# Patient Record
Sex: Male | Born: 1980 | Race: Black or African American | Hispanic: No | Marital: Single | Smoking: Current every day smoker
Health system: Southern US, Community
[De-identification: ages and names within clinical notes are randomized; demographics above are authoritative.]

## PROBLEM LIST (undated history)

## (undated) DIAGNOSIS — I10 Essential (primary) hypertension: Secondary | ICD-10-CM

---

## 2005-06-21 ENCOUNTER — Emergency Department: Payer: Self-pay | Admitting: Internal Medicine

## 2007-12-24 ENCOUNTER — Emergency Department: Payer: Self-pay | Admitting: Emergency Medicine

## 2009-04-26 ENCOUNTER — Emergency Department: Payer: Self-pay | Admitting: Emergency Medicine

## 2009-05-24 ENCOUNTER — Ambulatory Visit: Payer: Self-pay | Admitting: Family Medicine

## 2010-02-21 ENCOUNTER — Emergency Department: Payer: Self-pay | Admitting: Emergency Medicine

## 2010-03-13 ENCOUNTER — Emergency Department: Payer: Self-pay | Admitting: Internal Medicine

## 2010-06-13 ENCOUNTER — Emergency Department: Payer: Self-pay | Admitting: Emergency Medicine

## 2011-03-13 ENCOUNTER — Emergency Department: Payer: Self-pay | Admitting: Emergency Medicine

## 2011-05-22 ENCOUNTER — Emergency Department: Payer: Self-pay | Admitting: Internal Medicine

## 2011-12-11 ENCOUNTER — Emergency Department: Payer: Self-pay | Admitting: Emergency Medicine

## 2012-01-20 ENCOUNTER — Emergency Department: Payer: Self-pay | Admitting: Emergency Medicine

## 2012-10-28 ENCOUNTER — Emergency Department: Payer: Self-pay | Admitting: Emergency Medicine

## 2012-11-24 ENCOUNTER — Emergency Department: Payer: Self-pay | Admitting: Emergency Medicine

## 2013-02-21 ENCOUNTER — Ambulatory Visit: Payer: Self-pay | Admitting: Family Medicine

## 2013-02-21 DIAGNOSIS — R7611 Nonspecific reaction to tuberculin skin test without active tuberculosis: Secondary | ICD-10-CM | POA: Insufficient documentation

## 2013-09-01 ENCOUNTER — Emergency Department: Payer: Self-pay | Admitting: Internal Medicine

## 2014-02-28 LAB — HM HIV SCREENING LAB: HM HIV Screening: NEGATIVE

## 2014-03-08 ENCOUNTER — Emergency Department: Payer: Self-pay | Admitting: Emergency Medicine

## 2014-05-04 ENCOUNTER — Emergency Department: Payer: Self-pay | Admitting: Emergency Medicine

## 2014-08-20 ENCOUNTER — Emergency Department: Payer: Self-pay | Admitting: Emergency Medicine

## 2014-09-16 ENCOUNTER — Emergency Department: Payer: Self-pay | Admitting: Emergency Medicine

## 2014-12-21 ENCOUNTER — Emergency Department: Payer: Self-pay | Admitting: Internal Medicine

## 2015-03-02 ENCOUNTER — Encounter: Payer: Self-pay | Admitting: Emergency Medicine

## 2015-03-02 ENCOUNTER — Emergency Department
Admission: EM | Admit: 2015-03-02 | Discharge: 2015-03-02 | Disposition: A | Discharge status: Home / Self Care | Payer: Self-pay | Attending: Emergency Medicine | Admitting: Emergency Medicine

## 2015-03-02 ENCOUNTER — Emergency Department: Payer: Self-pay

## 2015-03-02 DIAGNOSIS — Y9289 Other specified places as the place of occurrence of the external cause: Secondary | ICD-10-CM | POA: Insufficient documentation

## 2015-03-02 DIAGNOSIS — Z720 Tobacco use: Secondary | ICD-10-CM | POA: Insufficient documentation

## 2015-03-02 DIAGNOSIS — X58XXXA Exposure to other specified factors, initial encounter: Secondary | ICD-10-CM | POA: Insufficient documentation

## 2015-03-02 DIAGNOSIS — Z87821 Personal history of retained foreign body fully removed: Secondary | ICD-10-CM

## 2015-03-02 DIAGNOSIS — T17208A Unspecified foreign body in pharynx causing other injury, initial encounter: Secondary | ICD-10-CM

## 2015-03-02 DIAGNOSIS — Y9389 Activity, other specified: Secondary | ICD-10-CM | POA: Insufficient documentation

## 2015-03-02 DIAGNOSIS — T17228A Food in pharynx causing other injury, initial encounter: Secondary | ICD-10-CM | POA: Insufficient documentation

## 2015-03-02 DIAGNOSIS — Y998 Other external cause status: Secondary | ICD-10-CM | POA: Insufficient documentation

## 2015-03-02 MED ORDER — PREDNISONE 20 MG PO TABS
ORAL_TABLET | ORAL | Status: AC
  Filled 2015-03-02: qty 3

## 2015-03-02 MED ORDER — CEPHALEXIN 500 MG PO CAPS
ORAL_CAPSULE | ORAL | Status: AC
  Filled 2015-03-02: qty 1

## 2015-03-02 NOTE — Discharge Instructions (Signed)
Swallowed Foreign Body, Adult You have swallowed an object (foreign body). Once the foreign body has passed through the food tube (esophagus), which leads from the mouth to the stomach, it will usually continue through the body without problems. This is because the point where the esophagus enters into the stomach is the narrowest place through which the foreign body must pass. Sometimes the foreign body gets stuck. The most common type of foreign body obstruction in adults is food impaction. Many times, bones from fish or meat products may become lodged in the esophagus or injure the throat on the way down. When there is an object that obstructs the esophagus, the most obvious symptoms are pain and the inability to swallow normally. In some cases, foreign bodies that can be life threatening are swallowed. Examples of these are certain medications and illicit drugs. Often in these instances, patients are afraid of telling what they swallowed. However, it is extremely important to tell the emergency caregiver what was swallowed because life-saving treatment may be needed.  X-ray exams may be taken to find the location of the foreign body. However, some objects do not show up well or may be too small to be seen on an X-ray image. If the foreign body is too large or too sharp, it may be too dangerous to allow it to pass on its own. You may need to see a caregiver who specializes in the digestive system (gastroenterologist). In a few cases, a specialist may need to remove the object using a method called "endoscopy". This involves passing a thin, soft, flexible tube into the food pipe to locate and remove the object. Follow up with your primary doctor or the referral you were given by the emergency caregiver. HOME CARE INSTRUCTIONS   If your caregiver says it is safe for you to eat, then only have liquids and soft foods until your symptoms improve.  Once you are eating normally:  Cut food into small  pieces.  Remove small bones from food.  Remove large seeds and pits from fruit.  Chew your food well.  Do not talk, laugh, or engage in physical activity while eating or swallowing. SEEK MEDICAL CARE IF:  You develop worsening shortness of breath, uncontrollable coughing, chest pains or high fever, greater than 102 F (38.9 C).  You are unable to eat or drink or you feel that food is getting stuck in your throat.  You have choking symptoms or cannot stop drooling.  You develop abdominal pain, vomiting (especially of blood), or rectal bleeding. MAKE SURE YOU:   Understand these instructions.  Will watch your condition.  Will get help right away if you are not doing well or get worse. Document Released: 03/26/2010 Document Revised: 12/29/2011 Document Reviewed: 03/26/2010 [facility] Patient Information 2015 [place] HillExitCare, [place]LLC. This information is not intended to replace advice given to you by your health care provider. Make sure you discuss any questions you have with your health care provider.  Today we successfully removed the fish bone stuck in the back of your throat.  You should not have any lasting effects from this accident or removal.  Rinse with salty-water to reduce any throat irritation.

## 2015-03-02 NOTE — ED Notes (Signed)
Few days ago ate some fish then next day developed soretroat, felt l;ike a fish bone was hung in his throat, no drooling, able to pass food and liquids, back of throat red with enlarged tonsils

## 2015-03-02 NOTE — ED Notes (Signed)
Pt with sore throat for two days. Thinks he might have a fish bone stuck in his throat. No resp distress noted.

## 2015-03-02 NOTE — ED Provider Notes (Signed)
[facility] Emergency Department Provider Note? ____________________________________________ ? Time seen: 1318 ? I have reviewed the triage vital signs and the nursing notes. ________ HISTORY ? Chief Complaint Sore Throat  HPI  David Peters is a 34 y.o. male who reports to the ED with foreign body sensation (FBS) to the throat. He describes it a few days ago he ate some fish, and the next day he didn't he developed a sore throat, and a scratchy sensation as if there was a bone hung in the right side of his throat. He was able to visualize the foreign body near the tonsils, but has been unable to manually dislodge it. He is here for evaluation and management. He denies any difficulty breathing, difficulty with oral secretions, shortness of breath, or swelling to the mouth or tonsils. He's been able to eat and drink without difficulty in the last few days.   Review of Systems  Constitutional: Negative for fever. Eyes: Negative for visual changes. ENT: Positive for sore throat & FBS. Cardiovascular: Negative for chest pain. Respiratory: Negative for shortness of breath. Gastrointestinal: Negative for abdominal pain, vomiting and diarrhea. Musculoskeletal: Negative for back pain. Skin: Negative for rash. Neurological: Negative for headaches, focal weakness or numbness.  10-point ROS otherwise negative. ____________________________________________  History reviewed. No pertinent past medical history.  There are no active problems to display for this patient. ? History reviewed. No pertinent past surgical history. ? No current outpatient prescriptions on file. ? Allergies Review of patient's allergies indicates no known allergies. ? No family history on file. ? Social History History  Substance Use Topics  . Smoking status: Current Every Day Smoker  . Smokeless tobacco: Not on file  . Alcohol Use: No   PHYSICAL EXAM:  VITAL SIGNS: ED Triage  Vitals  Enc Vitals Group     BP 03/02/15 1225 148/86 mmHg     Pulse Rate 03/02/15 1225 88     Resp 03/02/15 1225 20     Temp 03/02/15 1225 98.2 F (36.8 C)     Temp Source 03/02/15 1225 Oral     SpO2 03/02/15 1225 98 %     Weight 03/02/15 1225 220 lb (99.791 kg)     Height 03/02/15 1225 5\' 8"  (1.727 m)     Head Cir --      Peak Flow --      Pain Score 03/02/15 1226 5     Pain Loc --      Pain Edu? --      Excl. in GC? --    Constitutional: Alert and oriented. Well appearing and in no distress. Eyes: Conjunctivae are normal. PERRL. Normal extraocular movements. ENT   Head: Normocephalic and atraumatic.   Nose: No congestion/rhinnorhea.   Mouth/Throat: Mucous membranes are moist. There is a visible foreign body lodged in the right tonsillar region, consistent with a fish bone. Otherwise normal-appearing tonsils, normal midline uvula, and no edema to the oropharynx.      Ears: Normal external exam. Canals clear. TMs clear bilaterally.   Neck: Supple. No lymphadenopathy. Cardiovascular: Normal rate, regular rhythm. Normal and symmetric distal pulses are present in all extremities. No murmurs, rubs, or gallops. Respiratory: Normal respiratory effort without tachypnea nor retractions.  Musculoskeletal: Normal range of motion in all extremities.  Neurologic:  Normal speech and language. No gross focal neurologic deficits are appreciated.  Skin:  Skin is warm, dry and intact. No rash noted. Psychiatric: Mood and affect are normal. Patient exhibits appropriate insight  and judgment.  ___________ RADIOLOGY  Soft Tissue Neck  IMPRESSION: No abnormality noted. _____________ PROCEDURES ? Foreign Body Removal Procedure  Performed by - J. Tashawn Laswell, PA-C  FB visualized on gross exam - right peritonsilar area  Local anesthesia with cetacaine spray  Patient positioned - standing for comfort  FB manually removed using alligator forceps w/o difficulty  Patient tolerated  procedure without distress ______________________________________________________ INITIAL IMPRESSION / ASSESSMENT AND PLAN / ED COURSE ? Retained fish bone foreign body, visualized on exam, not appreciated on soft tissue films. Successful removal in the ED.  Pertinent labs & imaging results that were available during my care of the patient were reviewed by me and considered in my medical decision making (see chart for details).  ____________________________________________ FINAL CLINICAL IMPRESSION(S) / ED DIAGNOSES?  Final diagnoses:  Foreign body in throat, initial encounter  History of retained foreign body fully removed      Lissa HoardJenise V Bacon Jahmani Staup, PA-C 03/02/15 1714  Jene Everyobert Kinner, MD 03/02/15 [id number]  Jene Everyobert Kinner, MD 03/02/15 [phone]

## 2015-05-07 ENCOUNTER — Emergency Department: Payer: Self-pay

## 2015-05-07 ENCOUNTER — Emergency Department
Admission: EM | Admit: 2015-05-07 | Discharge: 2015-05-07 | Disposition: A | Discharge status: Home / Self Care | Payer: Self-pay | Attending: Emergency Medicine | Admitting: Emergency Medicine

## 2015-05-07 DIAGNOSIS — Z79899 Other long term (current) drug therapy: Secondary | ICD-10-CM | POA: Insufficient documentation

## 2015-05-07 DIAGNOSIS — R600 Localized edema: Secondary | ICD-10-CM

## 2015-05-07 DIAGNOSIS — R609 Edema, unspecified: Secondary | ICD-10-CM | POA: Insufficient documentation

## 2015-05-07 DIAGNOSIS — Z720 Tobacco use: Secondary | ICD-10-CM | POA: Insufficient documentation

## 2015-05-07 MED ORDER — IBUPROFEN 800 MG PO TABS: 800.0000 mg | ORAL_TABLET | Freq: Three times a day (TID) | ORAL | Status: DC | PRN

## 2015-05-07 MED ORDER — FUROSEMIDE 20 MG PO TABS: 20.0000 mg | ORAL_TABLET | Freq: Every day | ORAL | Status: DC

## 2015-05-07 NOTE — ED Notes (Signed)
To ultrasound

## 2015-05-07 NOTE — ED Provider Notes (Signed)
[facility]  ____________________________________________  Time seen: Approximately 3:08 PM  I have reviewed the triage vital signs and the nursing notes.   HISTORY  Chief Complaint Knee Pain   HPI Cleatrice BurkeCharlie J Connelley is a 34 y.o. male who presents with a sudden onset of right knee swelling and tenderness today. Patient states it felt like it went to sleep earlier and then noticed his lower leg was swollen as well denies any trauma no insect bites. He was working at work doing his normal job.   History reviewed. No pertinent past medical history.  There are no active problems to display for this patient.   History reviewed. No pertinent past surgical history.  Current Outpatient Rx  Name  Route  Sig  Dispense  Refill  . furosemide (LASIX) 20 MG tablet   Oral   Take 1 tablet (20 mg total) by mouth daily.   15 tablet   0   . ibuprofen (ADVIL,MOTRIN) 800 MG tablet   Oral   Take 1 tablet (800 mg total) by mouth every 8 (eight) hours as needed.   30 tablet   0     Allergies Review of patient's allergies indicates no known allergies.  No family history on file.  Social History History  Substance Use Topics  . Smoking status: Current Every Day Smoker -- 0.50 packs/day for 5 years    Types: Cigarettes  . Smokeless tobacco: Not on file  . Alcohol Use: Yes     Comment: pt states he drinks about a pint of liquor on the weekends.     Review of Systems Constitutional: No fever/chills Eyes: No visual changes. ENT: No sore throat. Cardiovascular: Denies chest pain. Respiratory: Denies shortness of breath. Gastrointestinal: No abdominal pain.  No nausea, no vomiting.  No diarrhea.  No constipation. Genitourinary: Negative for dysuria. Musculoskeletal: Negative for back pain. Skin: Negative for rash. Neurological: Negative for headaches, focal weakness or numbness.  10-point ROS otherwise  negative.  ____________________________________________   PHYSICAL EXAM:  VITAL SIGNS: ED Triage Vitals  Enc Vitals Group     BP --      Pulse --      Resp --      Temp --      Temp src --      SpO2 --      Weight --      Height --      Head Cir --      Peak Flow --      Pain Score --      Pain Loc --      Pain Edu? --      Excl. in GC? --     Constitutional: Alert and oriented. Well appearing and in no acute distress. Eyes: Conjunctivae are normal. PERRL. EOMI. Head: Atraumatic. Nose: No congestion/rhinnorhea. Mouth/Throat: Mucous membranes are moist.  Oropharynx non-erythematous. Neck: No stridor.   Cardiovascular: Normal rate, regular rhythm. Grossly normal heart sounds.  Good peripheral circulation. Respiratory: Normal respiratory effort.  No retractions. Lungs CTAB. Gastrointestinal: Soft and nontender. No distention. No abdominal bruits. No CVA tenderness. Musculoskeletal: No lower extremity tenderness nor edema.  No joint effusions. Positive edema to the right knee and right lower leg. Negative Homans sign Neurologic:  Normal speech and language. No gross focal neurologic deficits are appreciated. No gait instability. Skin:  Skin is warm, dry and intact. No rash noted. Psychiatric: Mood and affect are normal. Speech and behavior are normal.  ____________________________________________   LABS (all labs ordered are listed, but only abnormal results are displayed)  Labs Reviewed - No data to display ____________________________________________  EKG not applicable  RADIOLOGY  Ultrasound negative read by radiologist. ____________________________________________   PROCEDURES  Procedure(s) performed: None  Critical Care performed: No  ____________________________________________   INITIAL IMPRESSION / ASSESSMENT AND PLAN / ED COURSE  Pertinent labs & imaging results that were available during my care of the patient were reviewed by me and considered  in my medical decision making (see chart for details).  Right lower leg and knee edema. No known trauma. Rx given for Lasix 20 mg daily 3 days and ibuprofen 800 mg 3 times a day as needed work excuse 1 day given patient to follow up with PCP or return to the ER with any worsening symptomology. ____________________________________________   FINAL CLINICAL IMPRESSION(S) / ED DIAGNOSES  Final diagnoses:  Leg edema, right      Evangeline Dakin, PA-C 05/07/15 2027  Governor Rooks, MD 05/07/15 2329

## 2015-05-07 NOTE — ED Notes (Signed)
Pt c/o right knee pain, onset while at work but pt denies injury. Reports pain radiates down leg.

## 2015-05-07 NOTE — ED Notes (Signed)
Pt back from ultrasound.

## 2015-05-07 NOTE — Discharge Instructions (Signed)
Peripheral Edema °You have swelling in your legs (peripheral edema). This swelling is due to excess accumulation of salt and water in your body. Edema may be a sign of heart, kidney or liver disease, or a side effect of a medication. It may also be due to problems in the leg veins. Elevating your legs and using special support stockings may be very helpful, if the cause of the swelling is due to poor venous circulation. Avoid long periods of standing, whatever the cause. °Treatment of edema depends on identifying the cause. Chips, pretzels, pickles and other salty foods should be avoided. Restricting salt in your diet is almost always needed. Water pills (diuretics) are often used to remove the excess salt and water from your body via urine. These medicines prevent the kidney from reabsorbing sodium. This increases urine flow. °Diuretic treatment may also result in lowering of potassium levels in your body. Potassium supplements may be needed if you have to use diuretics daily. Daily weights can help you keep track of your progress in clearing your edema. You should call your caregiver for follow up care as recommended. °SEEK IMMEDIATE MEDICAL CARE IF:  °· You have increased swelling, pain, redness, or heat in your legs. °· You develop shortness of breath, especially when lying down. °· You develop chest or abdominal pain, weakness, or fainting. °· You have a fever. °Document Released: 11/13/2004 Document Revised: 12/29/2011 Document Reviewed: 10/24/2009 °ExitCare® Patient Information ©2015 [employer]. This information is not intended to replace advice given to you by your health care provider. Make sure you discuss any questions you have with your health care provider. ° °

## 2015-10-26 ENCOUNTER — Emergency Department
Admission: EM | Admit: 2015-10-26 | Discharge: 2015-10-26 | Disposition: A | Discharge status: Home / Self Care | Payer: Self-pay | Attending: Emergency Medicine | Admitting: Emergency Medicine

## 2015-10-26 ENCOUNTER — Encounter: Payer: Self-pay | Admitting: Emergency Medicine

## 2015-10-26 DIAGNOSIS — Z0189 Encounter for other specified special examinations: Secondary | ICD-10-CM

## 2015-10-26 DIAGNOSIS — L02411 Cutaneous abscess of right axilla: Secondary | ICD-10-CM | POA: Insufficient documentation

## 2015-10-26 DIAGNOSIS — F17210 Nicotine dependence, cigarettes, uncomplicated: Secondary | ICD-10-CM | POA: Insufficient documentation

## 2015-10-26 DIAGNOSIS — Z79899 Other long term (current) drug therapy: Secondary | ICD-10-CM | POA: Insufficient documentation

## 2015-10-26 DIAGNOSIS — Z7689 Persons encountering health services in other specified circumstances: Secondary | ICD-10-CM

## 2015-10-26 MED ORDER — TRAMADOL HCL 50 MG PO TABS
50.0000 mg | ORAL_TABLET | Freq: Once | ORAL | Status: AC
  Administered 2015-10-26: 50 mg via ORAL
  Filled 2015-10-26: qty 1

## 2015-10-26 MED ORDER — SULFAMETHOXAZOLE-TRIMETHOPRIM 800-160 MG PO TABS
1.0000 | ORAL_TABLET | Freq: Once | ORAL | Status: AC
  Administered 2015-10-26: 1 via ORAL
  Filled 2015-10-26: qty 1

## 2015-10-26 MED ORDER — SULFAMETHOXAZOLE-TRIMETHOPRIM 800-160 MG PO TABS: 1.0000 | ORAL_TABLET | Freq: Two times a day (BID) | ORAL | Status: DC

## 2015-10-26 MED ORDER — LIDOCAINE-EPINEPHRINE (PF) 1 %-1:200000 IJ SOLN
30.0000 mL | Freq: Once | INTRAMUSCULAR | Status: AC
  Administered 2015-10-26: 30 mL via INTRADERMAL
  Filled 2015-10-26: qty 30

## 2015-10-26 MED ORDER — OXYCODONE-ACETAMINOPHEN 5-325 MG PO TABS: 1.0000 | ORAL_TABLET | Freq: Three times a day (TID) | ORAL | Status: DC | PRN

## 2015-10-26 MED ORDER — PENTAFLUOROPROP-TETRAFLUOROETH EX AERO
1.0000 "application " | INHALATION_SPRAY | CUTANEOUS | Status: DC | PRN
  Filled 2015-10-26: qty 30

## 2015-10-26 NOTE — ED Notes (Signed)
Pt states painful raised area to right armpit, states hx of abscess

## 2015-10-26 NOTE — ED Provider Notes (Signed)
[facility] Provider Note ____________________________________________  Time seen: 1345  I have reviewed the triage vital signs and the nursing notes.  HISTORY  Chief Complaint  Abscess  HPI David BurkeCharlie J Sadowsky is a 35 y.o. male presents to the ED for evaluation of an abscess to the right armpit. He describes over the last 4 days he's had increased pain, tightness, swelling to the right axilla. He denies any spontaneous drainage at this time. He does note that at times he'll get abscesses to this area which spontaneously resolve with warm compresses. He denies any interim fevers, chills, or sweats.He rates his pain at a 9/10 in triage.  History reviewed. No pertinent past medical history.  There are no active problems to display for this patient.  History reviewed. No pertinent past surgical history.  Current Outpatient Rx  Name  Route  Sig  Dispense  Refill  . furosemide (LASIX) 20 MG tablet   Oral   Take 1 tablet (20 mg total) by mouth daily.   15 tablet   0   . ibuprofen (ADVIL,MOTRIN) 800 MG tablet   Oral   Take 1 tablet (800 mg total) by mouth every 8 (eight) hours as needed.   30 tablet   0   . oxyCODONE-acetaminophen (ROXICET) 5-325 MG tablet   Oral   Take 1 tablet by mouth every 8 (eight) hours as needed.   12 tablet   0   . sulfamethoxazole-trimethoprim (BACTRIM DS,SEPTRA DS) 800-160 MG tablet   Oral   Take 1 tablet by mouth 2 (two) times daily.   20 tablet   0    Allergies Review of patient's allergies indicates no known allergies.  History reviewed. No pertinent family history.  Social History Social History  Substance Use Topics  . Smoking status: Current Every Day Smoker -- 0.50 packs/day for 5 years    Types: Cigarettes  . Smokeless tobacco: None  . Alcohol Use: Yes     Comment: pt states he drinks about a pint of liquor on the weekends.     Review of Systems  Constitutional: Negative for  fever. Eyes: Negative for visual changes. ENT: Negative for sore throat. Cardiovascular: Negative for chest pain. Respiratory: Negative for shortness of breath. Gastrointestinal: Negative for abdominal pain, vomiting and diarrhea. Genitourinary: Negative for dysuria. Musculoskeletal: Negative for back pain. Skin: Negative for rash. Right axilla abscess as above Neurological: Negative for headaches, focal weakness or numbness. ____________________________________________  PHYSICAL EXAM:  VITAL SIGNS: ED Triage Vitals  Enc Vitals Group     BP 10/26/15 1230 166/98 mmHg     Pulse Rate 10/26/15 1230 90     Resp 10/26/15 1230 20     Temp 10/26/15 1230 98.6 F (37 C)     Temp Source 10/26/15 1230 Oral     SpO2 10/26/15 1230 96 %     Weight 10/26/15 1230 225 lb (102.059 kg)     Height 10/26/15 1230 5\' 8"  (1.727 m)     Head Cir --      Peak Flow --      Pain Score 10/26/15 1231 9     Pain Loc --      Pain Edu? --      Excl. in GC? --    Constitutional: Alert and oriented. Well appearing and in no distress. Head: Normocephalic and atraumatic.      Eyes: Conjunctivae are normal. PERRL. Normal extraocular movements      Ears: Canals clear. TMs  intact bilaterally.   Nose: No congestion/rhinorrhea.   Mouth/Throat: Mucous membranes are moist.   Neck: Supple. No thyromegaly. Hematological/Lymphatic/Immunological: No cervical lymphadenopathy. Cardiovascular: Normal rate, regular rhythm.  Respiratory: Normal respiratory effort. No wheezes/rales/rhonchi. Gastrointestinal: Soft and nontender. No distention. Musculoskeletal: Nontender with normal range of motion in all extremities.  Neurologic:  Normal gait without ataxia. Normal speech and language. No gross focal neurologic deficits are appreciated. Skin:  Skin is warm, dry and intact. No rash noted. Right axilla with a moderately indurated, deep, tender abscess formation noted. Small pustule noted without spontaneous  drainage. Psychiatric: Mood and affect are normal. Patient exhibits appropriate insight and judgment. ____________________________________________  PROCEDURES  Ultram 50 mg PO Bactrim DS 1 PO  INCISION AND DRAINAGE Performed by: Lissa Hoard Consent: Verbal consent obtained. Risks and benefits: risks, benefits and alternatives were discussed Type: abscess  Body area: right axilla  Anesthesia: local infiltration  Incision was made with a scalpel.  Local anesthetic: lidocaine 1% w/ epinephrine  Anesthetic total: 8 ml  Complexity: complex Blunt dissection to break up loculations  Drainage: purulent  Drainage amount: large  Packing material: 1/4 in iodoform gauze  Patient tolerance: Patient tolerated the procedure well with no immediate complications. ____________________________________________  INITIAL IMPRESSION / ASSESSMENT AND PLAN / ED COURSE  Patient with an acute abscess of the right axilla status post incision and drainage. He is discharged with wound care instructions including wound check in 2-3 days to remove packing. She is to apply warm compresses over the dressing is discharged with prescriptions for Bactrim and oxycodone to dose as directed. ____________________________________________  FINAL CLINICAL IMPRESSION(S) / ED DIAGNOSES  Final diagnoses:  Abscess of axilla, right  Encounter for incision and drainage procedure      Lissa Hoard, PA-C 10/26/15 1741  Arnaldo Natal, MD 10/26/15 2127

## 2015-10-26 NOTE — ED Notes (Signed)
Reports "boil" under right axilla

## 2015-10-26 NOTE — Discharge Instructions (Signed)
Incision and Drainage °Incision and drainage is a procedure in which a sac-like structure (cystic structure) is opened and drained. The area to be drained usually contains material such as pus, fluid, or blood.  °LET YOUR CAREGIVER KNOW ABOUT:  °· Allergies to medicine. °· Medicines taken, including vitamins, herbs, eyedrops, over-the-counter medicines, and creams. °· Use of steroids (by mouth or creams). °· Previous problems with anesthetics or numbing medicines. °· History of bleeding problems or blood clots. °· Previous surgery. °· Other health problems, including diabetes and kidney problems. °· Possibility of pregnancy, if this applies. °RISKS AND COMPLICATIONS °· Pain. °· Bleeding. °· Scarring. °· Infection. °BEFORE THE PROCEDURE  °You may need to have an ultrasound or other imaging tests to see how large or deep your cystic structure is. Blood tests may also be used to determine if you have an infection or how severe the infection is. You may need to have a tetanus shot. °PROCEDURE  °The affected area is cleaned with a cleaning fluid. The cyst area will then be numbed with a medicine (local anesthetic). A small incision will be made in the cystic structure. A syringe or catheter may be used to drain the contents of the cystic structure, or the contents may be squeezed out. The area will then be flushed with a cleansing solution. After cleansing the area, it is often gently packed with a gauze or another wound dressing. Once it is packed, it will be covered with gauze and tape or some other type of wound dressing.  °AFTER THE PROCEDURE  °· Often, you will be allowed to go home right after the procedure. °· You may be given antibiotic medicine to prevent or heal an infection. °· If the area was packed with gauze or some other wound dressing, you will likely need to come back in 1 to 2 days to get it removed. °· The area should heal in about 14 days. °  °This information is not intended to replace advice given  to you by your health care provider. Make sure you discuss any questions you have with your health care provider. °  °Document Released: 04/01/2001 Document Revised: 04/06/2012 Document Reviewed: 12/01/2011 °Elsevier Interactive Patient Education ©2016 Elsevier Inc. ° °Abscess °An abscess is an infected area that contains a collection of pus and debris. It can occur in almost any part of the body. An abscess is also known as a furuncle or boil. °CAUSES  °An abscess occurs when tissue gets infected. This can occur from blockage of oil or sweat glands, infection of hair follicles, or a minor injury to the skin. As the body tries to fight the infection, pus collects in the area and creates pressure under the skin. This pressure causes pain. People with weakened immune systems have difficulty fighting infections and get certain abscesses more often.  °SYMPTOMS °Usually an abscess develops on the skin and becomes a painful mass that is red, warm, and tender. If the abscess forms under the skin, you may feel a moveable soft area under the skin. Some abscesses break open (rupture) on their own, but most will continue to get worse without care. The infection can spread deeper into the body and eventually into the bloodstream, causing you to feel ill.  °DIAGNOSIS  °Your caregiver will take your medical history and perform a physical exam. A sample of fluid may also be taken from the abscess to determine what is causing your infection. °TREATMENT  °Your caregiver may prescribe antibiotic medicines to fight the infection.   However, taking antibiotics alone usually does not cure an abscess. Your caregiver may need to make a small cut (incision) in the abscess to drain the pus. In some cases, gauze is packed into the abscess to reduce pain and to continue draining the area. HOME CARE INSTRUCTIONS   Only take over-the-counter or prescription medicines for pain, discomfort, or fever as directed by your caregiver.  If you were  prescribed antibiotics, take them as directed. Finish them even if you start to feel better.  If gauze is used, follow your caregiver's directions for changing the gauze.  To avoid spreading the infection:  Keep your draining abscess covered with a bandage.  Wash your hands well.  Do not share personal care items, towels, or whirlpools with others.  Avoid skin contact with others.  Keep your skin and clothes clean around the abscess.  Keep all follow-up appointments as directed by your caregiver. SEEK MEDICAL CARE IF:   You have increased pain, swelling, redness, fluid drainage, or bleeding.  You have muscle aches, chills, or a general ill feeling.  You have a fever. MAKE SURE YOU:   Understand these instructions.  Will watch your condition.  Will get help right away if you are not doing well or get worse.   This information is not intended to replace advice given to you by your health care provider. Make sure you discuss any questions you have with your health care provider.   Document Released: 07/16/2005 Document Revised: 04/06/2012 Document Reviewed: 12/19/2011 Elsevier Interactive Patient Education [employer] Inc[employer]  Keep the area clean, dry, and covered. Apply warm compress over the bandage to promote healing. Put a warm cloth in a plastic sandwich bag. Take the antibiotic as directed. Take the pain medicine as needed.

## 2015-10-30 ENCOUNTER — Encounter: Payer: Self-pay | Admitting: Emergency Medicine

## 2015-10-30 ENCOUNTER — Emergency Department
Admission: EM | Admit: 2015-10-30 | Discharge: 2015-10-30 | Disposition: A | Discharge status: Home / Self Care | Payer: Self-pay | Attending: Emergency Medicine | Admitting: Emergency Medicine

## 2015-10-30 DIAGNOSIS — Z79899 Other long term (current) drug therapy: Secondary | ICD-10-CM | POA: Insufficient documentation

## 2015-10-30 DIAGNOSIS — Z792 Long term (current) use of antibiotics: Secondary | ICD-10-CM | POA: Insufficient documentation

## 2015-10-30 DIAGNOSIS — L02413 Cutaneous abscess of right upper limb: Secondary | ICD-10-CM | POA: Insufficient documentation

## 2015-10-30 DIAGNOSIS — F17210 Nicotine dependence, cigarettes, uncomplicated: Secondary | ICD-10-CM | POA: Insufficient documentation

## 2015-10-30 NOTE — ED Provider Notes (Signed)
CSN: [id number]     Arrival date & time 10/30/15  0805 History   None    Chief Complaint  Patient presents with  . Wound Check     HPI Comments: 35 year old male presents today for a wound check. Was seen on 10/26/15 by Paulino DoorJenise Bacon-Menshew, PA-C and has incision and drainage of right axillary abscess. Iodoform packing was placed at that time and it is still in the wound. No fevers, sweats or chills. Pain and drainage have almost completely subsided. Has a history of chronic abscesses.   The history is provided by the patient.    History reviewed. No pertinent past medical history. History reviewed. No pertinent past surgical history. No family history on file. Social History  Substance Use Topics  . Smoking status: Current Every Day Smoker -- 0.50 packs/day for 5 years    Types: Cigarettes  . Smokeless tobacco: None  . Alcohol Use: Yes     Comment: pt states he drinks about a pint of liquor on the weekends.     Review of Systems  Constitutional: Negative for fever and chills.  Skin: Positive for wound.  All other systems reviewed and are negative.     Allergies  Review of patient's allergies indicates no known allergies.  Home Medications   Prior to Admission medications   Medication Sig Start Date End Date Taking? Authorizing Provider  furosemide (LASIX) 20 MG tablet Take 1 tablet (20 mg total) by mouth daily. 05/07/15   Charmayne Sheerharles M Beers, PA-C  ibuprofen (ADVIL,MOTRIN) 800 MG tablet Take 1 tablet (800 mg total) by mouth every 8 (eight) hours as needed. 05/07/15   Evangeline Dakinharles M Beers, PA-C  oxyCODONE-acetaminophen (ROXICET) 5-325 MG tablet Take 1 tablet by mouth every 8 (eight) hours as needed. 10/26/15   Jenise V Bacon Menshew, PA-C  sulfamethoxazole-trimethoprim (BACTRIM DS,SEPTRA DS) 800-160 MG tablet Take 1 tablet by mouth 2 (two) times daily. 10/26/15   Jenise V Bacon Menshew, PA-C   BP 158/102 mmHg  Pulse 79  Temp(Src) 98 F (36.7 C) (Oral)  Resp 18  Ht 5\' 8"  (1.727 m)   Wt 102.059 kg  BMI 34.22 kg/m2  SpO2 97% Physical Exam  Constitutional: He is oriented to person, place, and time. Vital signs are normal. He appears well-developed and well-nourished. He is active.  Non-toxic appearance. He does not have a sickly appearance. He does not appear ill.  HENT:  Head: Normocephalic and atraumatic.  Neurological: He is alert and oriented to person, place, and time.  Skin: Skin is warm and dry.     Psychiatric: He has a normal mood and affect. His behavior is normal. Judgment and thought content normal.  Nursing note and vitals reviewed.   ED Course  Procedures (including critical care time) Labs Review Labs Reviewed - No data to display  Imaging Review No results found. I have personally reviewed and evaluated these images and lab results as part of my medical decision-making.   EKG Interpretation None      MDM  Iodoform packing removed by myself. Flushed the wound with normal saline 20cc. 4x4 sterile gauze taped over wound  Final diagnoses:  Abscess of arm, right        Christella Scheuermannmma Donyetta Ogletree V, PA-C 10/30/15 0831  Minna AntisKevin Paduchowski, MD 10/30/15 1531

## 2015-10-30 NOTE — ED Notes (Signed)
Here for wound check   And have packing removed

## 2015-10-30 NOTE — ED Notes (Signed)
States he had an abscess lanced 2 days ago   Here for packing removal

## 2015-11-12 ENCOUNTER — Encounter: Payer: Self-pay | Admitting: Emergency Medicine

## 2015-11-12 ENCOUNTER — Emergency Department
Admission: EM | Admit: 2015-11-12 | Discharge: 2015-11-12 | Disposition: A | Discharge status: Home / Self Care | Payer: Self-pay | Attending: Emergency Medicine | Admitting: Emergency Medicine

## 2015-11-12 DIAGNOSIS — Z79899 Other long term (current) drug therapy: Secondary | ICD-10-CM | POA: Insufficient documentation

## 2015-11-12 DIAGNOSIS — L02413 Cutaneous abscess of right upper limb: Secondary | ICD-10-CM

## 2015-11-12 DIAGNOSIS — L02411 Cutaneous abscess of right axilla: Secondary | ICD-10-CM | POA: Insufficient documentation

## 2015-11-12 DIAGNOSIS — F17210 Nicotine dependence, cigarettes, uncomplicated: Secondary | ICD-10-CM | POA: Insufficient documentation

## 2015-11-12 MED ORDER — OXYCODONE-ACETAMINOPHEN 5-325 MG PO TABS: 1.0000 | ORAL_TABLET | Freq: Four times a day (QID) | ORAL | Status: DC | PRN

## 2015-11-12 MED ORDER — HYDROMORPHONE HCL 1 MG/ML IJ SOLN
1.0000 mg | Freq: Once | INTRAMUSCULAR | Status: AC
  Administered 2015-11-12: 1 mg via INTRAMUSCULAR
  Filled 2015-11-12: qty 1

## 2015-11-12 MED ORDER — LIDOCAINE-EPINEPHRINE (PF) 1 %-1:200000 IJ SOLN
INTRAMUSCULAR | Status: AC
  Filled 2015-11-12: qty 30

## 2015-11-12 MED ORDER — SULFAMETHOXAZOLE-TRIMETHOPRIM 800-160 MG PO TABS: 1.0000 | ORAL_TABLET | Freq: Two times a day (BID) | ORAL | Status: DC

## 2015-11-12 MED ORDER — LIDOCAINE-EPINEPHRINE (PF) 1 %-1:200000 IJ SOLN
30.0000 mL | Freq: Once | INTRAMUSCULAR | Status: AC
  Administered 2015-11-12: 30 mL

## 2015-11-12 MED ORDER — DIAZEPAM 2 MG PO TABS
2.0000 mg | ORAL_TABLET | Freq: Once | ORAL | Status: AC
  Administered 2015-11-12: 2 mg via ORAL
  Filled 2015-11-12: qty 1

## 2015-11-12 NOTE — ED Provider Notes (Signed)
[facility] Emergency Department Provider Note ____________________________________________  Time seen: Approximately 7:30 AM  I have reviewed the triage vital signs and the nursing notes.   HISTORY  Chief Complaint Wound Check   HPI David Peters is a 35 y.o. male who presents to the emergency department for evaluation of abscess to the right axilla. He states the abscess was drained approximately a week ago and the packing was removed this past Thursday. He states that the abscess has returned and is bigger than before and feels that a second abscess has developed. He states that the area is excessively painful and is unable to rest his arm against his side. He denies chills or fever.  History reviewed. No pertinent past medical history.  There are no active problems to display for this patient.   History reviewed. No pertinent past surgical history.  Current Outpatient Rx  Name  Route  Sig  Dispense  Refill  . furosemide (LASIX) 20 MG tablet   Oral   Take 1 tablet (20 mg total) by mouth daily.   15 tablet   0   . ibuprofen (ADVIL,MOTRIN) 800 MG tablet   Oral   Take 1 tablet (800 mg total) by mouth every 8 (eight) hours as needed.   30 tablet   0   . oxyCODONE-acetaminophen (ROXICET) 5-325 MG tablet   Oral   Take 1 tablet by mouth every 6 (six) hours as needed.   12 tablet   0   . sulfamethoxazole-trimethoprim (BACTRIM DS,SEPTRA DS) 800-160 MG tablet   Oral   Take 1 tablet by mouth 2 (two) times daily.   20 tablet   0     Allergies Review of patient's allergies indicates no known allergies.  No family history on file.  Social History Social History  Substance Use Topics  . Smoking status: Current Every Day Smoker -- 0.50 packs/day for 5 years    Types: Cigarettes  . Smokeless tobacco: None  . Alcohol Use: Yes     Comment: pt states he drinks about a pint of liquor on the weekends.     Review of Systems   Constitutional:  No fever/chills Eyes: No visual changes. ENT: No congestion or rhinorrhea Cardiovascular: Denies chest pain. Respiratory: Denies shortness of breath. Gastrointestinal: No abdominal pain.  No nausea, no vomiting.  No diarrhea.  No constipation. Genitourinary: Negative for dysuria. Musculoskeletal: Negative for back pain. Skin: Positive for abscess under the right axilla Neurological: Negative for headaches, focal weakness or numbness.  10-point ROS otherwise negative.  ____________________________________________   PHYSICAL EXAM:  VITAL SIGNS: ED Triage Vitals  Enc Vitals Group     BP 11/12/15 0716 153/89 mmHg     Pulse Rate 11/12/15 0716 100     Resp 11/12/15 0716 18     Temp 11/12/15 0716 97.7 F (36.5 C)     Temp Source 11/12/15 0716 Oral     SpO2 11/12/15 0716 98 %     Weight 11/12/15 0716 250 lb (113.399 kg)     Height 11/12/15 0716  (1.727 m)     Head Cir --      Peak Flow --      Pain Score 11/12/15 0720 10     Pain Loc --      Pain Edu? --      Excl. in GC? --     Constitutional: Alert and oriented. Well appearing and in no acute distress. Eyes: Conjunctivae are normal. PERRL. EOMI.  Head: Atraumatic. Nose: No congestion/rhinnorhea. Mouth/Throat: Mucous membranes are moist.  Oropharynx non-erythematous. No oral lesions. Neck: No stridor. Cardiovascular: Normal rate, regular rhythm.  Good peripheral circulation. Respiratory: Normal respiratory effort.  No retractions. Lungs CTAB. Gastrointestinal: Soft and nontender. No distention. No abdominal bruits.  Musculoskeletal: No lower extremity tenderness nor edema.  No joint effusions. Neurologic:  Normal speech and language. No gross focal neurologic deficits are appreciated. Speech is normal. No gait instability. Skin:  2 fluctuant areas noted under the right axilla that measures approximately 4 cm each. There is surrounding erythema noted; Psychiatric: Mood and affect are normal. Speech and behavior are  normal.  ____________________________________________   LABS (all labs ordered are listed, but only abnormal results are displayed)  Labs Reviewed - No data to display ____________________________________________  EKG   ____________________________________________  RADIOLOGY   ____________________________________________   PROCEDURES  Procedure(s) performed:  INCISION AND DRAINAGE Performed by: Kem Boroughs Consent: Verbal consent obtained. Risks and benefits: risks, benefits and alternatives were discussed Type: abscess  Body area: right axilla  Anesthesia: local infiltration  Incision was made with a scalpel.  Local anesthetic: lidocaine 1% with epinephrine  Anesthetic total: 6 ml  Complexity: complex  Blunt dissection to break up loculations  Drainage: purulent  Drainage amount: Copious  Packing material: 1/4 in iodoform gauze  Patient tolerance: Patient tolerated the procedure well with no immediate complications.    ____________________________________________   INITIAL IMPRESSION / ASSESSMENT AND PLAN / ED COURSE  Pertinent labs & imaging results that were available during my care of the patient were reviewed by me and considered in my medical decision making (see chart for details).  Patient was advised to follow up with primary care provider in 2 days for packing removal or return here if unable to schedule an appointment. He was advised to complete the entire course of antibiotics. He states that he did not finish the Bactrim that was prescribed for the last abscess. He was encouraged to return sooner for worsening pain or fever. ____________________________________________   FINAL CLINICAL IMPRESSION(S) / ED DIAGNOSES  Final diagnoses:  Abscess of arm, right       Chinita Pester, FNP 11/12/15 1008  Sharman Cheek, MD 11/12/15 1436

## 2015-11-12 NOTE — Discharge Instructions (Signed)
Abscess °An abscess is an infected area that contains a collection of pus and debris. It can occur in almost any part of the body. An abscess is also known as a furuncle or boil. °CAUSES  °An abscess occurs when tissue gets infected. This can occur from blockage of oil or sweat glands, infection of hair follicles, or a minor injury to the skin. As the body tries to fight the infection, pus collects in the area and creates pressure under the skin. This pressure causes pain. People with weakened immune systems have difficulty fighting infections and get certain abscesses more often.  °SYMPTOMS °Usually an abscess develops on the skin and becomes a painful mass that is red, warm, and tender. If the abscess forms under the skin, you may feel a moveable soft area under the skin. Some abscesses break open (rupture) on their own, but most will continue to get worse without care. The infection can spread deeper into the body and eventually into the bloodstream, causing you to feel ill.  °DIAGNOSIS  °Your caregiver will take your medical history and perform a physical exam. A sample of fluid may also be taken from the abscess to determine what is causing your infection. °TREATMENT  °Your caregiver may prescribe antibiotic medicines to fight the infection. However, taking antibiotics alone usually does not cure an abscess. Your caregiver may need to make a small cut (incision) in the abscess to drain the pus. In some cases, gauze is packed into the abscess to reduce pain and to continue draining the area. °HOME CARE INSTRUCTIONS  °· Only take over-the-counter or prescription medicines for pain, discomfort, or fever as directed by your caregiver. °· If you were prescribed antibiotics, take them as directed. Finish them even if you start to feel better. °· If gauze is used, follow your caregiver's directions for changing the gauze. °· To avoid spreading the infection: °· Keep your draining abscess covered with a  bandage. °· Wash your hands well. °· Do not share personal care items, towels, or whirlpools with others. °· Avoid skin contact with others. °· Keep your skin and clothes clean around the abscess. °· Keep all follow-up appointments as directed by your caregiver. °SEEK MEDICAL CARE IF:  °· You have increased pain, swelling, redness, fluid drainage, or bleeding. °· You have muscle aches, chills, or a general ill feeling. °· You have a fever. °MAKE SURE YOU:  °· Understand these instructions. °· Will watch your condition. °· Will get help right away if you are not doing well or get worse. °  °This information is not intended to replace advice given to you by your health care provider. Make sure you discuss any questions you have with your health care provider. °  °Document Released: 07/16/2005 Document Revised: 04/06/2012 Document Reviewed: 12/19/2011 °Elsevier Interactive Patient Education ©2016 Elsevier Inc. ° °Incision and Drainage °Incision and drainage is a procedure in which a sac-like structure (cystic structure) is opened and drained. The area to be drained usually contains material such as pus, fluid, or blood.  °LET YOUR CAREGIVER KNOW ABOUT:  °· Allergies to medicine. °· Medicines taken, including vitamins, herbs, eyedrops, over-the-counter medicines, and creams. °· Use of steroids (by mouth or creams). °· Previous problems with anesthetics or numbing medicines. °· History of bleeding problems or blood clots. °· Previous surgery. °· Other health problems, including diabetes and kidney problems. °· Possibility of pregnancy, if this applies. °RISKS AND COMPLICATIONS °· Pain. °· Bleeding. °· Scarring. °· Infection. °BEFORE THE PROCEDURE  °  You may need to have an ultrasound or other imaging tests to see how large or deep your cystic structure is. Blood tests may also be used to determine if you have an infection or how severe the infection is. You may need to have a tetanus shot. °PROCEDURE  °The affected area  is cleaned with a cleaning fluid. The cyst area will then be numbed with a medicine (local anesthetic). A small incision will be made in the cystic structure. A syringe or catheter may be used to drain the contents of the cystic structure, or the contents may be squeezed out. The area will then be flushed with a cleansing solution. After cleansing the area, it is often gently packed with a gauze or another wound dressing. Once it is packed, it will be covered with gauze and tape or some other type of wound dressing.  °AFTER THE PROCEDURE  °· Often, you will be allowed to go home right after the procedure. °· You may be given antibiotic medicine to prevent or heal an infection. °· If the area was packed with gauze or some other wound dressing, you will likely need to come back in 1 to 2 days to get it removed. °· The area should heal in about 14 days. °  °This information is not intended to replace advice given to you by your health care provider. Make sure you discuss any questions you have with your health care provider. °  °Document Released: 04/01/2001 Document Revised: 04/06/2012 Document Reviewed: 12/01/2011 °Elsevier Interactive Patient Education ©2016 Elsevier Inc. ° °

## 2015-11-12 NOTE — ED Notes (Signed)
Abcess under right arm lanced a week ago, packing was removed last Thursday, pt states area is swollen and painful, pt states it burns to lift his arm.

## 2015-11-12 NOTE — ED Notes (Signed)
Pt presents with increased swelling and pain to right axilla. Pt reports pain as 10/10. Pt states pain increases with movement. Pt states was seen last week for abscess.

## 2015-11-15 ENCOUNTER — Emergency Department
Admission: EM | Admit: 2015-11-15 | Discharge: 2015-11-15 | Disposition: A | Discharge status: Home / Self Care | Payer: Self-pay | Attending: Emergency Medicine | Admitting: Emergency Medicine

## 2015-11-15 ENCOUNTER — Encounter: Payer: Self-pay | Admitting: Emergency Medicine

## 2015-11-15 DIAGNOSIS — Z79899 Other long term (current) drug therapy: Secondary | ICD-10-CM | POA: Insufficient documentation

## 2015-11-15 DIAGNOSIS — F17210 Nicotine dependence, cigarettes, uncomplicated: Secondary | ICD-10-CM | POA: Insufficient documentation

## 2015-11-15 DIAGNOSIS — Z792 Long term (current) use of antibiotics: Secondary | ICD-10-CM | POA: Insufficient documentation

## 2015-11-15 DIAGNOSIS — Z5189 Encounter for other specified aftercare: Secondary | ICD-10-CM

## 2015-11-15 DIAGNOSIS — Z4801 Encounter for change or removal of surgical wound dressing: Secondary | ICD-10-CM | POA: Insufficient documentation

## 2015-11-15 NOTE — Discharge Instructions (Signed)
Use warm compresses 4 times per day to encourage draining.  Finish your antibiotics as prescribed and until finished. If the pain and swelling returns, call the surgeon. If you are unable to schedule an appointment with Phineas Real or the surgeon, return to the ER.

## 2015-11-15 NOTE — ED Provider Notes (Signed)
[facility] Emergency Department Provider Note ____________________________________________  Time seen: Approximately 11:14 AM  I have reviewed the triage vital signs and the nursing notes.   HISTORY  Chief Complaint Wound Check  HPI David Peters is a 35 y.o. male who presents to the emergency department for wound check. He was here 11/12/15 for I and D of abscess under the right arm. He states that the packing came out yesterday. He feels the area is improving. He has noticed yellow drainage on the bandage. Pain is much better.He is taking his antibiotic as prescribed.  History reviewed. No pertinent past medical history.  There are no active problems to display for this patient.   History reviewed. No pertinent past surgical history.  Current Outpatient Rx  Name  Route  Sig  Dispense  Refill  . furosemide (LASIX) 20 MG tablet   Oral   Take 1 tablet (20 mg total) by mouth daily.   15 tablet   0   . ibuprofen (ADVIL,MOTRIN) 800 MG tablet   Oral   Take 1 tablet (800 mg total) by mouth every 8 (eight) hours as needed.   30 tablet   0   . oxyCODONE-acetaminophen (ROXICET) 5-325 MG tablet   Oral   Take 1 tablet by mouth every 6 (six) hours as needed.   12 tablet   0   . sulfamethoxazole-trimethoprim (BACTRIM DS,SEPTRA DS) 800-160 MG tablet   Oral   Take 1 tablet by mouth 2 (two) times daily.   20 tablet   0     Allergies Review of patient's allergies indicates no known allergies.  No family history on file.  Social History Social History  Substance Use Topics  . Smoking status: Current Every Day Smoker -- 0.50 packs/day for 5 years    Types: Cigarettes  . Smokeless tobacco: None  . Alcohol Use: Yes     Comment: pt states he drinks about a pint of liquor on the weekends.     Review of Systems Constitutional: No fever/chills Eyes: No visual changes. ENT: No sore throat. Cardiovascular: Denies chest pain. Respiratory: Denies  shortness of breath. Gastrointestinal: No nausea, no vomiting.  Musculoskeletal: Negative for back pain. Skin: Positive for abscess of right axilla. Neurological: Negative for headaches, focal weakness or numbness. 10-point ROS otherwise unremarkable.  ____________________________________________   PHYSICAL EXAM:  VITAL SIGNS: ED Triage Vitals  Enc Vitals Group     BP 11/15/15 1050 122/82 mmHg     Pulse Rate 11/15/15 1050 68     Resp 11/15/15 1050 18     Temp 11/15/15 1050 98.2 F (36.8 C)     Temp Source 11/15/15 1050 Oral     SpO2 11/15/15 1050 100 %     Weight 11/15/15 1050 250 lb (113.399 kg)     Height 11/15/15 1050  (1.727 m)     Head Cir --      Peak Flow --      Pain Score 11/15/15 1052 0     Pain Loc --      Pain Edu? --      Excl. in GC? --     Constitutional: Alert and oriented. Well appearing and in no acute distress. Eyes: Conjunctivae are normal. EOMI. Head: Atraumatic. Nose: No congestion/rhinnorhea. Mouth/Throat: Mucous membranes are moist.  Neck: No stridor. Cardiovascular:Good peripheral circulation. Respiratory: Normal respiratory effort.  No retractions.  Musculoskeletal: Full ROM throughout. Neurologic:  Normal speech and language. No gross focal neurologic deficits  are appreciated. Speech is normal. No gait instability. Skin: Right axilla continues to have induration and mild erythema. No fluctuance present today. Packing absent.  Psychiatric: Mood and affect are normal. Speech and behavior are normal.  ____________________________________________   LABS (all labs ordered are listed, but only abnormal results are displayed)  Labs Reviewed - No data to display ____________________________________________  RADIOLOGY  Not indicated ____________________________________________   PROCEDURES  Procedure(s) performed: Dressing change by myself.  ___________________________________________   INITIAL IMPRESSION / ASSESSMENT AND PLAN /  ED COURSE  Pertinent labs & imaging results that were available during my care of the patient were reviewed by me and considered in my medical decision making (see chart for details).  Patient was advised to continue his Bactrim and warm compresses 4 times per day. He was advised to schedule an appointment with surgery if the pain, swelling, and redness is not improving with the medication. Site has been opened twice here in the ER and he was informed the next step would be surgical intervention.  ____________________________________________   FINAL CLINICAL IMPRESSION(S) / ED DIAGNOSES  Final diagnoses:  Wound check, abscess       Chinita Pester, FNP 11/15/15 1213  Emily Filbert, MD 11/15/15 1252

## 2015-11-15 NOTE — ED Notes (Signed)
Here for wound check  Had abscess lanced 2 days ago 

## 2016-03-22 DIAGNOSIS — Z79899 Other long term (current) drug therapy: Secondary | ICD-10-CM | POA: Insufficient documentation

## 2016-03-22 DIAGNOSIS — M62830 Muscle spasm of back: Secondary | ICD-10-CM | POA: Insufficient documentation

## 2016-03-22 DIAGNOSIS — F17210 Nicotine dependence, cigarettes, uncomplicated: Secondary | ICD-10-CM | POA: Insufficient documentation

## 2016-03-22 DIAGNOSIS — Z792 Long term (current) use of antibiotics: Secondary | ICD-10-CM | POA: Insufficient documentation

## 2016-03-22 NOTE — ED Notes (Signed)
Pt ambulatory to triage with no difficulty. Pt reports spasms to his mid back for 3 days. . Reports hx of the same. States has had x rays and flexeril for this before.

## 2016-03-23 ENCOUNTER — Emergency Department
Admission: EM | Admit: 2016-03-23 | Discharge: 2016-03-23 | Disposition: A | Discharge status: Home / Self Care | Payer: Self-pay | Attending: Emergency Medicine | Admitting: Emergency Medicine

## 2016-03-23 DIAGNOSIS — M62830 Muscle spasm of back: Secondary | ICD-10-CM

## 2016-03-23 DIAGNOSIS — M546 Pain in thoracic spine: Secondary | ICD-10-CM

## 2016-03-23 MED ORDER — CYCLOBENZAPRINE HCL 5 MG PO TABS: 5.0000 mg | ORAL_TABLET | Freq: Three times a day (TID) | ORAL | Status: DC | PRN

## 2016-03-23 MED ORDER — KETOROLAC TROMETHAMINE 30 MG/ML IJ SOLN
60.0000 mg | Freq: Once | INTRAMUSCULAR | Status: DC
  Filled 2016-03-23: qty 2

## 2016-03-23 MED ORDER — IBUPROFEN 800 MG PO TABS: 800.0000 mg | ORAL_TABLET | Freq: Three times a day (TID) | ORAL | Status: DC | PRN

## 2016-03-23 NOTE — Discharge Instructions (Signed)
1. You may take medicines as needed for pain and muscle relaxation (Motrin/Flexeril #15). 2. Apply moist heat to affected area several times daily. 3. Return to the ER for worsening symptoms, persistent vomiting, difficulty breathing or other concerns.  Back Pain, Adult Back pain is very common. The pain often gets better over time. The cause of back pain is usually not dangerous. Most people can learn to manage their back pain on their own.  HOME CARE  Watch your back pain for any changes. The following actions may help to lessen any pain you are feeling:  Stay active. Start with short walks on flat ground if you can. Try to walk farther each day.  Exercise regularly as told by your doctor. Exercise helps your back heal faster. It also helps avoid future injury by keeping your muscles strong and flexible.  Do not sit, drive, or stand in one place for more than 30 minutes.  Do not stay in bed. Resting more than 1-2 days can slow down your recovery.  Be careful when you bend or lift an object. Use good form when lifting:  Bend at your knees.  Keep the object close to your body.  Do not twist.  Sleep on a firm mattress. Lie on your side, and bend your knees. If you lie on your back, put a pillow under your knees.  Take medicines only as told by your doctor.  Put ice on the injured area.  Put ice in a plastic bag.  Place a towel between your skin and the bag.  Leave the ice on for 20 minutes, 2-3 times a day for the first 2-3 days. After that, you can switch between ice and heat packs.  Avoid feeling anxious or stressed. Find good ways to deal with stress, such as exercise.  Maintain a healthy weight. Extra weight puts stress on your back. GET HELP IF:   You have pain that does not go away with rest or medicine.  You have worsening pain that goes down into your legs or buttocks.  You have pain that does not get better in one week.  You have pain at night.  You lose  weight.  You have a fever or chills. GET HELP RIGHT AWAY IF:   You cannot control when you poop (bowel movement) or pee (urinate).  Your arms or legs feel weak.  Your arms or legs lose feeling (numbness).  You feel sick to your stomach (nauseous) or throw up (vomit).  You have belly (abdominal) pain.  You feel like you may pass out (faint).   This information is not intended to replace advice given to you by your health care provider. Make sure you discuss any questions you have with your health care provider.   Document Released: 03/24/2008 Document Revised: 10/27/2014 Document Reviewed: 02/07/2014 Elsevier Interactive Patient Education [employer]2016 [employer] Inc.

## 2016-03-23 NOTE — ED Provider Notes (Signed)
[facility] Emergency Department Provider Note   ____________________________________________  Time seen: Approximately 4:33 AM  I have reviewed the triage vital signs and the nursing notes.   HISTORY  Chief Complaint Back Pain    HPI David Peters is a 35 y.o. male who presents to the ED from home with a chief complaint of back spasms. Patient has a history of thoracic back spasms with negative x-rays and treated with Flexeril with good relief of symptoms. Denies recent trauma or injury. Reports spasms to the right side of his mid back for the past 3 days. Denies associated fever, chills, chest pain, shortness of breath, abdominal pain, nausea, vomiting, diarrhea. Denies recent travel or hormone use. Nothing makes his symptoms better. Movement makes his symptoms worse.   Past medical history None  There are no active problems to display for this patient.   No past surgical history on file.  Current Outpatient Rx  Name  Route  Sig  Dispense  Refill  . furosemide (LASIX) 20 MG tablet   Oral   Take 1 tablet (20 mg total) by mouth daily.   15 tablet   0   . ibuprofen (ADVIL,MOTRIN) 800 MG tablet   Oral   Take 1 tablet (800 mg total) by mouth every 8 (eight) hours as needed.   30 tablet   0   . oxyCODONE-acetaminophen (ROXICET) 5-325 MG tablet   Oral   Take 1 tablet by mouth every 6 (six) hours as needed.   12 tablet   0   . sulfamethoxazole-trimethoprim (BACTRIM DS,SEPTRA DS) 800-160 MG tablet   Oral   Take 1 tablet by mouth 2 (two) times daily.   20 tablet   0     Allergies Review of patient's allergies indicates no known allergies.  No family history on file.  Social History Social History  Substance Use Topics  . Smoking status: Current Every Day Smoker -- 0.50 packs/day for 5 years    Types: Cigarettes  . Smokeless tobacco: Not on file  . Alcohol Use: Yes     Comment: pt states he drinks about a pint of liquor on the  weekends.     Review of Systems  Constitutional: No fever/chills. Eyes: No visual changes. ENT: No sore throat. Cardiovascular: Denies chest pain. Respiratory: Denies shortness of breath. Gastrointestinal: No abdominal pain.  No nausea, no vomiting.  No diarrhea.  No constipation. Genitourinary: Negative for dysuria. Musculoskeletal: Positive for back pain. Skin: Negative for rash. Neurological: Negative for headaches, focal weakness or numbness.  10-point ROS otherwise negative.  ____________________________________________   PHYSICAL EXAM:  VITAL SIGNS: ED Triage Vitals  Enc Vitals Group     BP 03/23/16 0001 111/58 mmHg     Pulse Rate 03/23/16 0001 81     Resp 03/23/16 0001 18     Temp 03/23/16 0001 98.1 F (36.7 C)     Temp Source 03/23/16 0001 Oral     SpO2 03/23/16 0001 96 %     Weight 03/23/16 0001 220 lb (99.791 kg)     Height 03/23/16 0001  (1.702 m)     Head Cir --      Peak Flow --      Pain Score 03/23/16 0002 9     Pain Loc --      Pain Edu? --      Excl. in GC? --     Constitutional: Asleep, awakened for exam. Alert and oriented. Well appearing and  in no acute distress. Eyes: Conjunctivae are normal. PERRL. EOMI. Head: Atraumatic. Nose: No congestion/rhinnorhea. Mouth/Throat: Mucous membranes are moist.  Oropharynx non-erythematous. Neck: No stridor.  No cervical spine tenderness to palpation. Cardiovascular: Normal rate, regular rhythm. Grossly normal heart sounds.  Good peripheral circulation. Respiratory: Normal respiratory effort.  No retractions. Lungs CTAB. Gastrointestinal: Soft and nontender. No distention. No abdominal bruits. No CVA tenderness. Musculoskeletal: No spinal tenderness to palpation. Right mid-thoracic paraspinal muscle spasms. No lower extremity tenderness nor edema.  No joint effusions. Neurologic:  Normal speech and language. No gross focal neurologic deficits are appreciated. No gait instability. Skin:  Skin is warm,  dry and intact. No rash noted. Psychiatric: Mood and affect are normal. Speech and behavior are normal.  ____________________________________________   LABS (all labs ordered are listed, but only abnormal results are displayed)  Labs Reviewed - No data to display ____________________________________________  EKG  None ____________________________________________  RADIOLOGY  None ____________________________________________   PROCEDURES  Procedure(s) performed: None  Critical Care performed: No  ____________________________________________   INITIAL IMPRESSION / ASSESSMENT AND PLAN / ED COURSE  Pertinent labs & imaging results that were available during my care of the patient were reviewed by me and considered in my medical decision making (see chart for details).  35 year old male who presents with back pain and spasms; history of same. Will administer NSAIDs here as patient is driving. Prescription for NSAIDs and muscle relaxer; follow-up with orthopedics. Strict return precautions given. Patient verbalizes understanding and agrees with plan of care. ____________________________________________   FINAL CLINICAL IMPRESSION(S) / ED DIAGNOSES  Final diagnoses:  Right-sided thoracic back pain  Muscle spasm of back      NEW MEDICATIONS STARTED DURING THIS VISIT:  New Prescriptions   No medications on file     Note:  This document was prepared using Dragon voice recognition software and may include unintentional dictation errors.    Irean HongJade J Taler Kushner, MD 03/23/16 [phone]

## 2016-07-04 ENCOUNTER — Encounter: Payer: Self-pay | Admitting: Emergency Medicine

## 2016-07-04 ENCOUNTER — Emergency Department
Admission: EM | Admit: 2016-07-04 | Discharge: 2016-07-04 | Disposition: A | Discharge status: Home / Self Care | Payer: Self-pay | Attending: Emergency Medicine | Admitting: Emergency Medicine

## 2016-07-04 ENCOUNTER — Emergency Department: Payer: Self-pay

## 2016-07-04 DIAGNOSIS — Z791 Long term (current) use of non-steroidal anti-inflammatories (NSAID): Secondary | ICD-10-CM | POA: Insufficient documentation

## 2016-07-04 DIAGNOSIS — Y92009 Unspecified place in unspecified non-institutional (private) residence as the place of occurrence of the external cause: Secondary | ICD-10-CM | POA: Insufficient documentation

## 2016-07-04 DIAGNOSIS — Y999 Unspecified external cause status: Secondary | ICD-10-CM | POA: Insufficient documentation

## 2016-07-04 DIAGNOSIS — W25XXXA Contact with sharp glass, initial encounter: Secondary | ICD-10-CM | POA: Insufficient documentation

## 2016-07-04 DIAGNOSIS — Y9301 Activity, walking, marching and hiking: Secondary | ICD-10-CM | POA: Insufficient documentation

## 2016-07-04 DIAGNOSIS — F17210 Nicotine dependence, cigarettes, uncomplicated: Secondary | ICD-10-CM | POA: Insufficient documentation

## 2016-07-04 DIAGNOSIS — S91312A Laceration without foreign body, left foot, initial encounter: Secondary | ICD-10-CM | POA: Insufficient documentation

## 2016-07-04 DIAGNOSIS — Z792 Long term (current) use of antibiotics: Secondary | ICD-10-CM | POA: Insufficient documentation

## 2016-07-04 DIAGNOSIS — Z79899 Other long term (current) drug therapy: Secondary | ICD-10-CM | POA: Insufficient documentation

## 2016-07-04 DIAGNOSIS — Z23 Encounter for immunization: Secondary | ICD-10-CM | POA: Insufficient documentation

## 2016-07-04 MED ORDER — NAPROXEN 500 MG PO TABS: 500.0000 mg | ORAL_TABLET | Freq: Two times a day (BID) | ORAL | 0 refills | Status: DC

## 2016-07-04 MED ORDER — TETANUS-DIPHTH-ACELL PERTUSSIS 5-2.5-18.5 LF-MCG/0.5 IM SUSP
0.5000 mL | Freq: Once | INTRAMUSCULAR | Status: AC
  Administered 2016-07-04: 0.5 mL via INTRAMUSCULAR
  Filled 2016-07-04: qty 0.5

## 2016-07-04 NOTE — ED Triage Notes (Signed)
Reports a piece of glass in left foot.  Ambulates well.

## 2016-07-04 NOTE — Discharge Instructions (Signed)
Place a callous pad (a thick round bandaid with a hole in the middle) around the area to alleviate pressure when walking. You can find these at [facility], [facility] or [facility].

## 2016-07-04 NOTE — ED Provider Notes (Signed)
[facility] Emergency Department Provider Note  ____________________________________________  Time seen: Approximately 12:35 PM  I have reviewed the triage vital signs and the nursing notes.   HISTORY  Chief Complaint Foreign Body    HPI David Peters is a 35 y.o. male , NAD, presents to the emergency department with one-day history of pain of the left ball of the foot.  Patient states he believes a piece of glass or other object may have gotten lodged in his foot.  He reports that a glass ashtray was broken in his home last night.  Woke this morning and walked in the same area and believes a shard may have pricked the bottom of his foot. He states that he poked at this area with plastic forceps to attempt to remove any foreign body and states nothing was removed.   Patient reports increased pain with pressure placed on foot and states that he must walk on the lateral part of the foot.  He rates his pain as 8.5/10.  Has not noted any active bleeding, oozing, weeping. No redness or swelling about the area. Uncertain of last tetanus.   History reviewed. No pertinent past medical history.  There are no active problems to display for this patient.   History reviewed. No pertinent surgical history.  Prior to Admission medications   Medication Sig Start Date End Date Taking? Authorizing Provider  cyclobenzaprine (FLEXERIL) 5 MG tablet Take 1 tablet (5 mg total) by mouth every 8 (eight) hours as needed for muscle spasms. 03/23/16   Irean Hong, MD  furosemide (LASIX) 20 MG tablet Take 1 tablet (20 mg total) by mouth daily. 05/07/15   Charmayne Sheer Beers, PA-C  ibuprofen (ADVIL,MOTRIN) 800 MG tablet Take 1 tablet (800 mg total) by mouth every 8 (eight) hours as needed for moderate pain. 03/23/16   Irean Hong, MD  naproxen (NAPROSYN) 500 MG tablet Take 1 tablet (500 mg total) by mouth 2 (two) times daily with a meal. 07/04/16   Vannie Hochstetler L Leiya Keesey, PA-C  oxyCODONE-acetaminophen  (ROXICET) 5-325 MG tablet Take 1 tablet by mouth every 6 (six) hours as needed. 11/12/15   Chinita Pester, FNP  sulfamethoxazole-trimethoprim (BACTRIM DS,SEPTRA DS) 800-160 MG tablet Take 1 tablet by mouth 2 (two) times daily. 11/12/15   Chinita Pester, FNP    Allergies Review of patient's allergies indicates no known allergies.  No family history on file.  Social History Social History  Substance Use Topics  . Smoking status: Current Every Day Smoker    Packs/day: 0.50    Years: 5.00    Types: Cigarettes  . Smokeless tobacco: Never Used  . Alcohol use Yes     Comment: pt states he drinks about a pint of liquor on the weekends.      Review of Systems  Constitutional: No fever/chills Musculoskeletal: Positive for pain of ball of left foot. Denies loss of ROM of feet or toes.   Skin: Positive superficial laceration left sole of foot. Negative for erythema, edema, oozing, weeping, active bleeding.  Neurological: Negative for Numbness, weakness, tingling. 10-point ROS otherwise negative.  ____________________________________________   PHYSICAL EXAM:  VITAL SIGNS: ED Triage Vitals  Enc Vitals Group     BP 07/04/16 1215 (!) 142/86     Pulse Rate 07/04/16 1215 89     Resp 07/04/16 1215 16     Temp 07/04/16 1215 98.6 F (37 C)     Temp Source 07/04/16 1215 Oral  SpO2 07/04/16 1215 97 %     Weight 07/04/16 1202 210 lb (95.3 kg)     Height 07/04/16 1202 5\' 9"  (1.753 m)     Head Circumference --      Peak Flow --      Pain Score 07/04/16 1202 3     Pain Loc --      Pain Edu? --      Excl. in GC? --      Constitutional: Alert and oriented. Well appearing and in no acute distress. Eyes:  Conjunctiva without erythema or icterus bilaterally. Head: Atraumatic Cardiovascular: Good peripheral circulationWith 2+ pulses noted in the left lower extremity. Capillary refill is brisk in all digits of left foot. Respiratory: Normal respiratory effort without tachypnea or  retractions.  Musculoskeletal: Tenderness on palpation of medial aspect of ball of left footWithout crepitus or bony abnormality.  Full range of motion of left foot, toes, ankle. Neurologic:  Normal speech and language. No gross focal neurologic deficits are appreciated.Gait and posture are normal  Skin:  Small 2mm, superficial opening in skin on ball of left foot below 2nd metatarsal. Scant dry blood is noted in the area. No foreign body or mass noted to deep palpation of the area. No surrounding redness, swelling noted. Skin is warm, dry. No rash noted. Psychiatric: Mood and affect are normal. Speech and behavior are normal. Patient exhibits appropriate insight and judgement.   ____________________________________________   LABS  None ____________________________________________  EKG  None ____________________________________________  RADIOLOGY I have personally viewed and evaluated these images (plain radiographs) as part of my medical decision making, as well as reviewing the written report by the radiologist.  Dg Foot Complete Left  Result Date: 07/04/2016 CLINICAL DATA:  Plantar left foot injury in the second/ third metatarsal region, stepped on glass. EXAM: LEFT FOOT - COMPLETE 3+ VIEW COMPARISON:  None. FINDINGS: No fracture, dislocation or suspicious focal osseous lesion. No radiopaque foreign body. No appreciable degenerative or erosive arthropathy. IMPRESSION: Negative.  No radiopaque foreign body. Electronically Signed   By: Delbert Phenix M.D.   On: 07/04/2016 12:58    DG Foot Complete Left: No bony abnormalities visible.  No evidence of foreign body or gas trapping in the foot.  ____________________________________________    PROCEDURES  Procedure(s) performed: None   Procedures   Medications  Tdap (BOOSTRIX) injection 0.5 mL (0.5 mLs Intramuscular Given 07/04/16 1309)     ____________________________________________   INITIAL IMPRESSION / ASSESSMENT AND  PLAN / ED COURSE  Pertinent labs & imaging results that were available during my care of the patient were reviewed by me and considered in my medical decision making (see chart for details).  Clinical Course  Value Comment By Time  DG Foot Complete Left (Reviewed) [place] Gayden L Andilyn Bettcher, PA-C 09/15 1248    Patient's diagnosis is consistent with Laceration of sole of left foot without foreign body. Patient will be discharged home with prescriptions for naproxen to take as directed. Patient is to keep the area clean and dry. Advise to place a callus pad over the area to limit pressure. Patient is to follow up with [facility] if symptoms persist past this treatment course. Patient is given ED precautions to return to the ED for any worsening or new symptoms.    ____________________________________________  FINAL CLINICAL IMPRESSION(S) / ED DIAGNOSES  Final diagnoses:  Laceration of sole of foot, left, initial encounter      NEW MEDICATIONS STARTED DURING THIS VISIT:  Discharge Medication List  as of 07/04/2016  1:09 PM    START taking these medications   Details  naproxen (NAPROSYN) 500 MG tablet Take 1 tablet (500 mg total) by mouth 2 (two) times daily with a meal., Starting Fri 07/04/2016, Print             Ernestene KielJami L GrandinHagler, PA-C 07/04/16 1551    Jeanmarie PlantJames A McShane, MD 07/05/16 [phone]

## 2016-08-27 ENCOUNTER — Emergency Department
Admission: EM | Admit: 2016-08-27 | Discharge: 2016-08-27 | Disposition: A | Discharge status: Home / Self Care | Payer: Commercial Managed Care - PPO | Attending: Emergency Medicine | Admitting: Emergency Medicine

## 2016-08-27 ENCOUNTER — Encounter: Payer: Self-pay | Admitting: Emergency Medicine

## 2016-08-27 DIAGNOSIS — Y93F2 Activity, caregiving, lifting: Secondary | ICD-10-CM | POA: Insufficient documentation

## 2016-08-27 DIAGNOSIS — Z79899 Other long term (current) drug therapy: Secondary | ICD-10-CM | POA: Diagnosis not present

## 2016-08-27 DIAGNOSIS — H6121 Impacted cerumen, right ear: Secondary | ICD-10-CM | POA: Diagnosis not present

## 2016-08-27 DIAGNOSIS — F17210 Nicotine dependence, cigarettes, uncomplicated: Secondary | ICD-10-CM | POA: Diagnosis not present

## 2016-08-27 DIAGNOSIS — Z791 Long term (current) use of non-steroidal anti-inflammatories (NSAID): Secondary | ICD-10-CM | POA: Insufficient documentation

## 2016-08-27 DIAGNOSIS — Y929 Unspecified place or not applicable: Secondary | ICD-10-CM | POA: Insufficient documentation

## 2016-08-27 DIAGNOSIS — Y990 Civilian activity done for income or pay: Secondary | ICD-10-CM | POA: Diagnosis not present

## 2016-08-27 DIAGNOSIS — X501XXA Overexertion from prolonged static or awkward postures, initial encounter: Secondary | ICD-10-CM | POA: Diagnosis not present

## 2016-08-27 DIAGNOSIS — R030 Elevated blood-pressure reading, without diagnosis of hypertension: Secondary | ICD-10-CM | POA: Insufficient documentation

## 2016-08-27 DIAGNOSIS — M62838 Other muscle spasm: Secondary | ICD-10-CM | POA: Diagnosis not present

## 2016-08-27 DIAGNOSIS — H9191 Unspecified hearing loss, right ear: Secondary | ICD-10-CM | POA: Insufficient documentation

## 2016-08-27 DIAGNOSIS — M62830 Muscle spasm of back: Secondary | ICD-10-CM

## 2016-08-27 DIAGNOSIS — S299XXA Unspecified injury of thorax, initial encounter: Secondary | ICD-10-CM | POA: Diagnosis present

## 2016-08-27 MED ORDER — NAPROXEN 500 MG PO TABS: 500.0000 mg | ORAL_TABLET | Freq: Two times a day (BID) | ORAL | 0 refills | Status: DC

## 2016-08-27 MED ORDER — CARBAMIDE PEROXIDE 6.5 % OT SOLN: 5.0000 [drp] | Freq: Two times a day (BID) | OTIC | 2 refills | Status: DC

## 2016-08-27 MED ORDER — CYCLOBENZAPRINE HCL 10 MG PO TABS: 10.0000 mg | ORAL_TABLET | Freq: Three times a day (TID) | ORAL | 0 refills | Status: DC | PRN

## 2016-08-27 NOTE — ED Provider Notes (Signed)
[facility] Emergency Department Provider Note   ____________________________________________   First MD Initiated Contact with Patient 08/27/16 [phone]     (approximate)  I have reviewed the triage vital signs and the nursing notes.   HISTORY  Chief Complaint Back Pain    HPI David Peters is a 35 y.o. male patient complaining of right sided back spasm for 3-4 days. Patient stated this didn't admitting problem for number years. Patient denies any provocative incident or injury. Patient denies associated fevers chills, chest pain, shortness breath, abdominal pain, or nausea vomiting diarrhea. Patient state his work does require repetitive lifting and bending patient also complaining of decreased hearing in the right ear. Patient states transient relief with over-the-counter ear drops. Patient denies any URI signs symptoms.   History reviewed. No pertinent past medical history.  There are no active problems to display for this patient.   History reviewed. No pertinent surgical history.  Prior to Admission medications   Medication Sig Start Date End Date Taking? Authorizing Provider  carbamide peroxide (DEBROX) 6.5 % otic solution Place 5 drops into the right ear 2 (two) times daily. 08/27/16 08/27/17  Joni Reiningonald K Tinley Rought, PA-C  cyclobenzaprine (FLEXERIL) 10 MG tablet Take 1 tablet (10 mg total) by mouth 3 (three) times daily as needed. 08/27/16   Joni Reiningonald K Malijah Lietz, PA-C  cyclobenzaprine (FLEXERIL) 5 MG tablet Take 1 tablet (5 mg total) by mouth every 8 (eight) hours as needed for muscle spasms. 03/23/16   Irean HongJade J Sung, MD  furosemide (LASIX) 20 MG tablet Take 1 tablet (20 mg total) by mouth daily. 05/07/15   Charmayne Sheerharles M Beers, PA-C  ibuprofen (ADVIL,MOTRIN) 800 MG tablet Take 1 tablet (800 mg total) by mouth every 8 (eight) hours as needed for moderate pain. 03/23/16   Irean HongJade J Sung, MD  naproxen (NAPROSYN) 500 MG tablet Take 1 tablet (500 mg total) by mouth 2 (two) times  daily with a meal. 07/04/16   Jami L Hagler, PA-C  naproxen (NAPROSYN) 500 MG tablet Take 1 tablet (500 mg total) by mouth 2 (two) times daily with a meal. 08/27/16   Joni Reiningonald K Enrique Manganaro, PA-C  oxyCODONE-acetaminophen (ROXICET) 5-325 MG tablet Take 1 tablet by mouth every 6 (six) hours as needed. 11/12/15   Chinita Pesterari B Triplett, FNP  sulfamethoxazole-trimethoprim (BACTRIM DS,SEPTRA DS) 800-160 MG tablet Take 1 tablet by mouth 2 (two) times daily. 11/12/15   Chinita Pesterari B Triplett, FNP    Allergies Patient has no known allergies.  No family history on file.  Social History Social History  Substance Use Topics  . Smoking status: Current Every Day Smoker    Packs/day: 0.50    Years: 5.00    Types: Cigarettes  . Smokeless tobacco: Never Used  . Alcohol use Yes     Comment: pt states he drinks about a pint of liquor on the weekends.     Review of Systems Constitutional: No fever/chills Eyes: No visual changes. ENT: No sore throat.Decreased hearing Cardiovascular: Denies chest pain. Respiratory: Denies shortness of breath. Gastrointestinal: No abdominal pain.  No nausea, no vomiting.  No diarrhea.  No constipation. Genitourinary: Negative for dysuria. Musculoskeletal: Positive for back pain. Skin: Negative for rash. Neurological: Negative for headaches, focal weakness or numbness. ____________________________________________   PHYSICAL EXAM:  VITAL SIGNS: ED Triage Vitals  Enc Vitals Group     BP 08/27/16 0708 (!) 163/91     Pulse Rate 08/27/16 0708 85     Resp 08/27/16 0708 16  Temp 08/27/16 0708 98.7 F (37.1 C)     Temp Source 08/27/16 0708 Oral     SpO2 08/27/16 0708 98 %     Weight 08/27/16 0706 215 lb (97.5 kg)     Height 08/27/16 0706 5\' 10"  (1.778 m)     Head Circumference --      Peak Flow --      Pain Score 08/27/16 0706 8     Pain Loc --      Pain Edu? --      Excl. in GC? --     Constitutional: Alert and oriented. Well appearing and in no acute distress. Eyes:  Conjunctivae are normal. PERRL. EOMI. Head: Atraumatic. Nose: No congestion/rhinnorhea. EARS: Slight buildup of soft cerumen right ear canal. Mouth/Throat: Mucous membranes are moist.  Oropharynx non-erythematous. Neck: No stridor.  No cervical spine tenderness to palpation. Hematological/Lymphatic/Immunilogical: No cervical lymphadenopathy. Cardiovascular: Normal rate, regular rhythm. Grossly normal heart sounds.  Good peripheral circulation. Elevated blood pressure Respiratory: Normal respiratory effort.  No retractions. Lungs CTAB. Gastrointestinal: Soft and nontender. No distention. No abdominal bruits. No CVA tenderness. Musculoskeletal: No obvious deformity of the lumbar spine. Patient is some moderate guarding palpation inferior scapular area. Patient had right paraspinal muscle spasm with lateral movements. Patient negative straight leg test.  Neurologic:  Normal speech and language. No gross focal neurologic deficits are appreciated. No gait instability. Skin:  Skin is warm, dry and intact. No rash noted. Psychiatric: Mood and affect are normal. Speech and behavior are normal.  ____________________________________________   LABS (all labs ordered are listed, but only abnormal results are displayed)  Labs Reviewed - No data to display ____________________________________________  EKG   ____________________________________________  RADIOLOGY   ____________________________________________   PROCEDURES  Procedure(s) performed: None  Procedures  Critical Care performed: No  ____________________________________________   INITIAL IMPRESSION / ASSESSMENT AND PLAN / ED COURSE  Pertinent labs & imaging results that were available during my care of the patient were reviewed by me and considered in my medical decision making (see chart for details).  Back pain with muscle spasm. Patient given discharge care instructions. Patient given a prescription for Flexeril and  naproxen. Patient given a work note  Clinical Course    Patient is driving so with start with NSAIDs and follow-up with muscle relaxant. Patient given driving precautions operation machine precaution while taking muscle relaxants.  ____________________________________________   FINAL CLINICAL IMPRESSION(S) / ED DIAGNOSES  Final diagnoses:  Muscle spasm of back  Hearing loss of right ear due to cerumen impaction  Elevated blood pressure reading      NEW MEDICATIONS STARTED DURING THIS VISIT:  New Prescriptions   CARBAMIDE PEROXIDE (DEBROX) 6.5 % OTIC SOLUTION    Place 5 drops into the right ear 2 (two) times daily.   CYCLOBENZAPRINE (FLEXERIL) 10 MG TABLET    Take 1 tablet (10 mg total) by mouth 3 (three) times daily as needed.   NAPROXEN (NAPROSYN) 500 MG TABLET    Take 1 tablet (500 mg total) by mouth 2 (two) times daily with a meal.     Note:  This document was prepared using Dragon voice recognition software and may include unintentional dictation errors.    Joni Reiningonald K Zaire Vanbuskirk, PA-C 08/27/16 [id number]    Minna AntisKevin Paduchowski, MD 08/27/16 1416

## 2016-08-27 NOTE — ED Triage Notes (Signed)
Reports back spasms and ears stopped up.  NAD

## 2016-08-27 NOTE — ED Notes (Signed)
States he developed right sided back spasm like pain about 3-4 days ago  Denies any recent injury but has had hx of same.  also having some decreased hearing and feels like ears are stopped up

## 2016-09-16 ENCOUNTER — Encounter: Payer: Self-pay | Admitting: Emergency Medicine

## 2016-09-16 ENCOUNTER — Emergency Department
Admission: EM | Admit: 2016-09-16 | Discharge: 2016-09-16 | Disposition: A | Discharge status: Home / Self Care | Payer: Commercial Managed Care - PPO | Attending: Emergency Medicine | Admitting: Emergency Medicine

## 2016-09-16 DIAGNOSIS — J069 Acute upper respiratory infection, unspecified: Secondary | ICD-10-CM | POA: Diagnosis not present

## 2016-09-16 DIAGNOSIS — Z791 Long term (current) use of non-steroidal anti-inflammatories (NSAID): Secondary | ICD-10-CM | POA: Insufficient documentation

## 2016-09-16 DIAGNOSIS — B9789 Other viral agents as the cause of diseases classified elsewhere: Secondary | ICD-10-CM

## 2016-09-16 DIAGNOSIS — R05 Cough: Secondary | ICD-10-CM | POA: Diagnosis present

## 2016-09-16 DIAGNOSIS — F17210 Nicotine dependence, cigarettes, uncomplicated: Secondary | ICD-10-CM | POA: Diagnosis not present

## 2016-09-16 LAB — POCT RAPID STREP A: Streptococcus, Group A Screen (Direct): NEGATIVE

## 2016-09-16 MED ORDER — PSEUDOEPH-BROMPHEN-DM 30-2-10 MG/5ML PO SYRP: 10.0000 mL | ORAL_SOLUTION | Freq: Four times a day (QID) | ORAL | 0 refills | Status: DC | PRN

## 2016-09-16 MED ORDER — FLUTICASONE PROPIONATE 50 MCG/ACT NA SUSP: 2.0000 | Freq: Every day | NASAL | 0 refills | Status: DC

## 2016-09-16 NOTE — ED Notes (Signed)
See triage note  Cough   Congestion since Sunday  Body aches and sore throat

## 2016-09-16 NOTE — ED Triage Notes (Signed)
Patient ambulatory to triage with steady gait, without difficulty or distress noted, mask in place; pt reports having chills, congestion since Sunday; prod cough yellow sputum

## 2016-09-16 NOTE — ED Provider Notes (Signed)
[facility] Emergency Department Provider Note  ____________________________________________  Time seen: Approximately 7:16 AM  I have reviewed the triage vital signs and the nursing notes.   HISTORY  Chief Complaint Cough and Nasal Congestion    HPI David BurkeCharlie J Peters is a 35 y.o. male , NAD, presents to emergency department with 2 day history of nasal congestion, runny nose, cough and chest congestion. Patient states he had onset of symptoms approximately 2 days ago. Over the last 24 hours he has noted yellow phlegm production of cough and nasal secretions as well as decreased appetite. States he was unable to sleep last night due to having multiple episodes of soft bowel movements. Denies any diarrhea, nausea but did have one episode of emesis this morning. Has had no hematochezia or hemoptysis. Denies any chest pain, shortness of breath or wheezing. Has had no abdominal pain. Denies any sick contacts. Has had no fevers or body aches but states he felt chilled last night. Has not taken anything over-the-counter for his symptoms.   History reviewed. No pertinent past medical history.  There are no active problems to display for this patient.   History reviewed. No pertinent surgical history.  Prior to Admission medications   Medication Sig Start Date End Date Taking? Authorizing Provider  brompheniramine-pseudoephedrine-DM 30-2-10 MG/5ML syrup Take 10 mLs by mouth 4 (four) times daily as needed. 09/16/16   Jami L Hagler, PA-C  fluticasone (FLONASE) 50 MCG/ACT nasal spray Place 2 sprays into both nostrils daily. 09/16/16   Jami L Hagler, PA-C  furosemide (LASIX) 20 MG tablet Take 1 tablet (20 mg total) by mouth daily. 05/07/15   Charmayne Sheerharles M Beers, PA-C  ibuprofen (ADVIL,MOTRIN) 800 MG tablet Take 1 tablet (800 mg total) by mouth every 8 (eight) hours as needed for moderate pain. 03/23/16   Irean HongJade J Sung, MD    Allergies Patient has no known allergies.  No family  history on file.  Social History Social History  Substance Use Topics  . Smoking status: Current Every Day Smoker    Packs/day: 0.50    Years: 5.00    Types: Cigarettes  . Smokeless tobacco: Never Used  . Alcohol use Yes     Comment: pt states he drinks about a pint of liquor on the weekends.      Review of Systems  Constitutional: Positive chills, decreased appetite. No fever, rigors, fatigue. Eyes: No discharge ENT: Positive nasal congestion, runny nose, scratchy throat. No sinus pressure, ear pain, ear drainage. Cardiovascular: No chest pain. Respiratory: Positive productive cough. No shortness of breath. No wheezing.  Gastrointestinal: Positive vomiting times one that has resolved. No abdominal pain.  No nausea.  No diarrhea.  No constipation. Musculoskeletal: Negative for general myalgias.  Skin: Negative for rash. Neurological: Negative for headaches. 10-point ROS otherwise negative.  ____________________________________________   PHYSICAL EXAM:  VITAL SIGNS: ED Triage Vitals  Enc Vitals Group     BP 09/16/16 0653 131/66     Pulse Rate 09/16/16 0653 82     Resp 09/16/16 0653 20     Temp 09/16/16 0653 98.2 F (36.8 C)     Temp Source 09/16/16 0653 Oral     SpO2 09/16/16 0653 97 %     Weight 09/16/16 0650 215 lb (97.5 kg)     Height 09/16/16 0650 5\' 10"  (1.778 m)     Head Circumference --      Peak Flow --      Pain Score --  Pain Loc --      Pain Edu? --      Excl. in GC? --      Constitutional: Alert and oriented. Well appearing and in no acute distress. Eyes: Conjunctivae are normal Without icterus, injection or discharge Head: Atraumatic. ENT:      Ears: TMs visualized bilaterally with mild serous effusion but no erythema, bulging or perforation.      Nose: Moderate congestion with moderate clear rhinorrhea. Turbinates are injected      Mouth/Throat: Mucous membranes are moist. Pharynx with mild injection but no swelling, exudate. Uvula is  midline. Airway is patent. Clear postnasal drip. Neck: Supple with full range of motion Hematological/Lymphatic/Immunilogical: No cervical lymphadenopathy. Cardiovascular: Normal rate, regular rhythm. Normal S1 and S2.  Good peripheral circulation. Respiratory: Normal respiratory effort without tachypnea or retractions. Lungs CTAB with breath sounds noted in all lung fields. No wheeze, rhonchi, rales Neurologic:  Normal speech and language. No gross focal neurologic deficits are appreciated.  Skin:  Skin is warm, dry and intact. No rash noted. Psychiatric: Mood and affect are normal. Speech and behavior are normal. Patient exhibits appropriate insight and judgement.   ____________________________________________   LABS (all labs ordered are listed, but only abnormal results are displayed)  Labs Reviewed  POCT RAPID STREP A   ____________________________________________  EKG  None ____________________________________________  RADIOLOGY  None ____________________________________________    PROCEDURES  Procedure(s) performed: None   Procedures   Medications - No data to display   ____________________________________________   INITIAL IMPRESSION / ASSESSMENT AND PLAN / ED COURSE  Pertinent labs & imaging results that were available during my care of the patient were reviewed by me and considered in my medical decision making (see chart for details).  Clinical Course     Patient's diagnosis is consistent with Viral URI with cough. Patient will be discharged home with prescriptions for Bromfed-DM and Flonase to take as directed. May take over-the-counter Tylenol or ibuprofen as needed [facility] or his primary care provider at the [facility] if symptoms persist past this treatment course. Patient is given ED precautions to return to the ED for any worsening or new symptoms.     ____________________________________________  FINAL CLINICAL IMPRESSION(S) / ED DIAGNOSES  Final diagnoses:  Viral URI with cough      NEW MEDICATIONS STARTED DURING THIS VISIT:  Discharge Medication List as of 09/16/2016  7:44 AM    START taking these medications   Details  brompheniramine-pseudoephedrine-DM 30-2-10 MG/5ML syrup Take 10 mLs by mouth 4 (four) times daily as needed., Starting Tue 09/16/2016, Print    fluticasone (FLONASE) 50 MCG/ACT nasal spray Place 2 sprays into both nostrils daily., Starting Tue 09/16/2016, Print             Ernestene KielJami L Lake ValleyHagler, PA-C 09/16/16 [id number]    Sharman CheekPhillip Stafford, MD 09/17/16 [phone]

## 2016-10-28 ENCOUNTER — Encounter: Payer: Self-pay | Admitting: *Deleted

## 2016-10-28 ENCOUNTER — Emergency Department
Admission: EM | Admit: 2016-10-28 | Discharge: 2016-10-28 | Disposition: A | Discharge status: Home / Self Care | Payer: Commercial Managed Care - PPO | Attending: Emergency Medicine | Admitting: Emergency Medicine

## 2016-10-28 DIAGNOSIS — R05 Cough: Secondary | ICD-10-CM | POA: Diagnosis present

## 2016-10-28 DIAGNOSIS — B9789 Other viral agents as the cause of diseases classified elsewhere: Secondary | ICD-10-CM

## 2016-10-28 DIAGNOSIS — F17210 Nicotine dependence, cigarettes, uncomplicated: Secondary | ICD-10-CM | POA: Insufficient documentation

## 2016-10-28 DIAGNOSIS — J069 Acute upper respiratory infection, unspecified: Secondary | ICD-10-CM | POA: Diagnosis not present

## 2016-10-28 LAB — RAPID INFLUENZA A&B ANTIGENS: Influenza A (ARMC): NEGATIVE

## 2016-10-28 LAB — RAPID INFLUENZA A&B ANTIGENS (ARMC ONLY): INFLUENZA B (ARMC): NEGATIVE

## 2016-10-28 NOTE — ED Provider Notes (Signed)
[facility] Emergency Department Provider Note   ____________________________________________   First MD Initiated Contact with Patient 10/28/16 [phone]     (approximate)  I have reviewed the triage vital signs and the nursing notes.   HISTORY  Chief Complaint Cough and Generalized Body Aches    HPI David Peters is a 36 y.o. male is here with complaint of productive cough, body aches and fever 4 days. Patient states that his symptoms came on suddenly. Patient did not get the flu shot this year. He is unaware of any exposure to slow. Patient has continued to eat and drink without any difficulty. He is taken and minimal over-the-counter medication for his symptoms. Patient states that he continues to smoke half pack cigarettes per day. Currently he rates his discomfort as an 8/10.   History reviewed. No pertinent past medical history.  There are no active problems to display for this patient.   History reviewed. No pertinent surgical history.  Prior to Admission medications   Medication Sig Start Date End Date Taking? Authorizing Provider  brompheniramine-pseudoephedrine-DM 30-2-10 MG/5ML syrup Take 10 mLs by mouth 4 (four) times daily as needed. 09/16/16   Jami L Hagler, PA-C  fluticasone (FLONASE) 50 MCG/ACT nasal spray Place 2 sprays into both nostrils daily. 09/16/16   Jami L Hagler, PA-C  furosemide (LASIX) 20 MG tablet Take 1 tablet (20 mg total) by mouth daily. 05/07/15   Charmayne Sheer Beers, PA-C  ibuprofen (ADVIL,MOTRIN) 800 MG tablet Take 1 tablet (800 mg total) by mouth every 8 (eight) hours as needed for moderate pain. 03/23/16   Irean Hong, MD    Allergies Patient has no known allergies.  History reviewed. No pertinent family history.  Social History Social History  Substance Use Topics  . Smoking status: Current Every Day Smoker    Packs/day: 0.50    Years: 5.00    Types: Cigarettes  . Smokeless tobacco: Never Used  . Alcohol use  Yes     Comment: pt states he drinks about a pint of liquor on the weekends.     Review of Systems Constitutional: Subjective fever/chills ENT: No sore throat. Cardiovascular: Denies chest pain. Respiratory: Denies shortness of breath. Gastrointestinal: No abdominal pain.  No nausea, no vomiting.  No diarrhea. Genitourinary: Negative for dysuria. Musculoskeletal: Positive for generalized body aches. Skin: Negative for rash. Neurological: Negative for headaches, focal weakness or numbness.  10-point ROS otherwise negative.  ____________________________________________   PHYSICAL EXAM:  VITAL SIGNS: ED Triage Vitals  Enc Vitals Group     BP 10/28/16 0810 (!) 123/96     Pulse Rate 10/28/16 0810 89     Resp 10/28/16 0810 18     Temp 10/28/16 0810 97.6 F (36.4 C)     Temp Source 10/28/16 0810 Oral     SpO2 10/28/16 0810 99 %     Weight 10/28/16 0811 210 lb (95.3 kg)     Height 10/28/16 0811 5\' 7"  (1.702 m)     Head Circumference --      Peak Flow --      Pain Score 10/28/16 0811 8     Pain Loc --      Pain Edu? --      Excl. in GC? --     Constitutional: Alert and oriented. Well appearing and in no acute distress. Eyes: Conjunctivae are normal. PERRL. EOMI. Head: Atraumatic. Nose: Minimal congestion/no rhinnorhea.  EACs are clear bilaterally. TMs are dull bilaterally without injection or  erythema. Mouth/Throat: Mucous membranes are moist.  Oropharynx non-erythematous. Neck: No stridor.   Hematological/Lymphatic/Immunilogical: No cervical lymphadenopathy. Cardiovascular: Normal rate, regular rhythm. Grossly normal heart sounds.  Good peripheral circulation. Respiratory: Normal respiratory effort.  No retractions. Lungs CTAB. Musculoskeletal: Moves upper and lower extremities without any difficulty. Normal gait was noted. Neurologic:  Normal speech and language. No gross focal neurologic deficits are appreciated. No gait instability. Skin:  Skin is warm, dry and intact.  No rash noted. Psychiatric: Mood and affect are normal. Speech and behavior are normal.  ____________________________________________   LABS (all labs ordered are listed, but only abnormal results are displayed)  Labs Reviewed  RAPID INFLUENZA A&B ANTIGENS ([facility] ONLY)   _ PROCEDURES  Procedure(s) performed: None  Procedures  Critical Care performed: No  ____________________________________________   INITIAL IMPRESSION / ASSESSMENT AND PLAN / ED COURSE  Pertinent labs & imaging results that were available during my care of the patient were reviewed by me and considered in my medical decision making (see chart for details).    Clinical Course    Patient was made aware that his influenza test was negative. He is to continue taking over-the-counter medication for his cough and symptoms. He is encouraged take Tylenol or ibuprofen as needed for muscle aches. He is to follow-up with [facility] if any continued problems.  ____________________________________________   FINAL CLINICAL IMPRESSION(S) / ED DIAGNOSES  Final diagnoses:  Viral URI with cough      NEW MEDICATIONS STARTED DURING THIS VISIT:  Discharge Medication List as of 10/28/2016  9:11 AM       Note:  This document was prepared using Dragon voice recognition software and may include unintentional dictation errors.    Tommi RumpsRhonda L Jameson Tormey, PA-C 10/28/16 1640    Sharman CheekPhillip Stafford, MD 11/01/16 1504

## 2016-10-28 NOTE — ED Triage Notes (Signed)
States productive yellow cough, body aches and fever since Friday

## 2016-10-28 NOTE — Discharge Instructions (Signed)
Continue your medication that David Peters Yesterday  Tylenol or ibuprofen as needed for muscle aches, fever or headache Increase fluids.

## 2017-04-03 ENCOUNTER — Emergency Department
Admission: EM | Admit: 2017-04-03 | Discharge: 2017-04-03 | Disposition: A | Discharge status: Home / Self Care | Payer: Commercial Managed Care - PPO | Attending: Emergency Medicine | Admitting: Emergency Medicine

## 2017-04-03 DIAGNOSIS — F17210 Nicotine dependence, cigarettes, uncomplicated: Secondary | ICD-10-CM | POA: Diagnosis not present

## 2017-04-03 DIAGNOSIS — M549 Dorsalgia, unspecified: Secondary | ICD-10-CM | POA: Diagnosis present

## 2017-04-03 DIAGNOSIS — M62830 Muscle spasm of back: Secondary | ICD-10-CM | POA: Insufficient documentation

## 2017-04-03 DIAGNOSIS — Z79899 Other long term (current) drug therapy: Secondary | ICD-10-CM | POA: Diagnosis not present

## 2017-04-03 MED ORDER — CYCLOBENZAPRINE HCL 10 MG PO TABS: 10.0000 mg | ORAL_TABLET | Freq: Three times a day (TID) | ORAL | 0 refills | Status: DC | PRN

## 2017-04-03 MED ORDER — NAPROXEN 500 MG PO TABS: 500.0000 mg | ORAL_TABLET | Freq: Two times a day (BID) | ORAL | Status: DC

## 2017-04-03 NOTE — ED Triage Notes (Signed)
Pt states that he has been having intermittent mid to lower back pain x2 weeks and that it has gotten worse.  Pt denies taking anything PTA.  Pt A&Ox4, ambulatory to triage.

## 2017-04-03 NOTE — ED Notes (Signed)
Patient here for chronic pain management. Denies any injury or trauma. Ambulatory to room without difficulty.

## 2017-04-03 NOTE — ED Provider Notes (Signed)
[facility] Emergency Department Provider Note   ____________________________________________   First MD Initiated Contact with Patient 04/03/17 [id number]     (approximate)  I have reviewed the triage vital signs and the nursing notes.   HISTORY  Chief Complaint Back Pain    HPI David Peters is a 36 y.o. male patient complaining of mid to low back pain for 2 weeks. Paced the pain was worse this morning upon awakening. Patient denies any provocative measures for this complaint. Patient denies any radicular component to his back pain. Patient has bladder or bowel dysfunction. Patient rates his pain as a 10 over 10.Patient describes his pain as "spasmatic". No palliative measures taken for this complaint.   History reviewed. No pertinent past medical history.  There are no active problems to display for this patient.   History reviewed. No pertinent surgical history.  Prior to Admission medications   Medication Sig Start Date End Date Taking? Authorizing Provider  brompheniramine-pseudoephedrine-DM 30-2-10 MG/5ML syrup Take 10 mLs by mouth 4 (four) times daily as needed. 09/16/16   Hagler, Jami L, PA-C  cyclobenzaprine (FLEXERIL) 10 MG tablet Take 1 tablet (10 mg total) by mouth 3 (three) times daily as needed. 04/03/17   Joni ReiningSmith, Abdon Petrosky K, PA-C  fluticasone (FLONASE) 50 MCG/ACT nasal spray Place 2 sprays into both nostrils daily. 09/16/16   Hagler, Jami L, PA-C  furosemide (LASIX) 20 MG tablet Take 1 tablet (20 mg total) by mouth daily. 05/07/15   Beers, Charmayne Sheerharles M, PA-C  ibuprofen (ADVIL,MOTRIN) 800 MG tablet Take 1 tablet (800 mg total) by mouth every 8 (eight) hours as needed for moderate pain. 03/23/16   Irean HongSung, Jade J, MD  naproxen (NAPROSYN) 500 MG tablet Take 1 tablet (500 mg total) by mouth 2 (two) times daily with a meal. 04/03/17   Joni ReiningSmith, Caitlinn Klinker K, PA-C    Allergies Patient has no known allergies.  No family history on file.  Social History Social  History  Substance Use Topics  . Smoking status: Current Every Day Smoker    Packs/day: 0.50    Years: 5.00    Types: Cigarettes  . Smokeless tobacco: Never Used  . Alcohol use Yes     Comment: pt states he drinks about a pint of liquor on the weekends.     Review of Systems  Constitutional: No fever/chills Eyes: No visual changes. ENT: No sore throat. Cardiovascular: Denies chest pain. Respiratory: Denies shortness of breath. Gastrointestinal: No abdominal pain.  No nausea, no vomiting.  No diarrhea.  No constipation. Genitourinary: Negative for dysuria. Musculoskeletal: Positive for back pain. Skin: Negative for rash. Neurological: Negative for headaches, focal weakness or numbness.   ____________________________________________   PHYSICAL EXAM:  VITAL SIGNS: ED Triage Vitals [04/03/17 0646]  Enc Vitals Group     BP (!) 150/79     Pulse Rate 82     Resp 20     Temp 98.4 F (36.9 C)     Temp Source Oral     SpO2 94 %     Weight 215 lb (97.5 kg)     Height 5\' 8"  (1.727 m)     Head Circumference      Peak Flow      Pain Score 10     Pain Loc      Pain Edu?      Excl. in GC?     Constitutional: Alert and oriented. Well appearing and in no acute distress. Neck: No stridor.  No  cervical spine tenderness to palpation. Hematological/Lymphatic/Immunilogical: No cervical lymphadenopathy. Cardiovascular: Normal rate, regular rhythm. Grossly normal heart sounds.  Good peripheral circulation. Respiratory: Normal respiratory effort.  No retractions. Lungs CTAB. Musculoskeletal: No obvious spinal deformity. Patient had moderate guarding palpation paraspinal muscle area of the thoracic and lumbar spine. No lower extremity tenderness nor edema.  No joint effusions. Neurologic:  Normal speech and language. No gross focal neurologic deficits are appreciated. No gait instability. Skin:  Skin is warm, dry and intact. No rash noted. Psychiatric: Mood and affect are normal.  Speech and behavior are normal.  ____________________________________________   LABS (all labs ordered are listed, but only abnormal results are displayed)  Labs Reviewed - No data to display ____________________________________________  EKG   ____________________________________________  RADIOLOGY  No results found.  ____________________________________________   PROCEDURES  Procedure(s) performed: None  Procedures  Critical Care performed: No  ____________________________________________   INITIAL IMPRESSION / ASSESSMENT AND PLAN / ED COURSE  Pertinent labs & imaging results that were available during my care of the patient were reviewed by me and considered in my medical decision making (see chart for details).  Thoracic and lumbar strain. Patient given discharge care instructions. Patient given a work note. Patient advised to follow PCP if condition persists.      ____________________________________________   FINAL CLINICAL IMPRESSION(S) / ED DIAGNOSES  Final diagnoses:  Muscle spasm of back      NEW MEDICATIONS STARTED DURING THIS VISIT:  Discharge Medication List as of 04/03/2017  7:15 AM    START taking these medications   Details  cyclobenzaprine (FLEXERIL) 10 MG tablet Take 1 tablet (10 mg total) by mouth 3 (three) times daily as needed., Starting Fri 04/03/2017, Print    naproxen (NAPROSYN) 500 MG tablet Take 1 tablet (500 mg total) by mouth 2 (two) times daily with a meal., Starting Fri 04/03/2017, Print         Note:  This document was prepared using Dragon voice recognition software and may include unintentional dictation errors.    Joni Reining, PA-C 04/03/17 0720    Minna Antis, MD 04/03/17 1455

## 2018-01-07 ENCOUNTER — Ambulatory Visit: Payer: Self-pay | Admitting: Podiatry

## 2018-02-07 ENCOUNTER — Other Ambulatory Visit: Payer: Self-pay

## 2018-02-07 ENCOUNTER — Encounter: Payer: Self-pay | Admitting: Emergency Medicine

## 2018-02-07 ENCOUNTER — Emergency Department
Admission: EM | Admit: 2018-02-07 | Discharge: 2018-02-07 | Disposition: A | Discharge status: Home / Self Care | Payer: Commercial Managed Care - PPO | Attending: Emergency Medicine | Admitting: Emergency Medicine

## 2018-02-07 DIAGNOSIS — L2089 Other atopic dermatitis: Secondary | ICD-10-CM | POA: Insufficient documentation

## 2018-02-07 DIAGNOSIS — R21 Rash and other nonspecific skin eruption: Secondary | ICD-10-CM | POA: Insufficient documentation

## 2018-02-07 DIAGNOSIS — F17210 Nicotine dependence, cigarettes, uncomplicated: Secondary | ICD-10-CM | POA: Insufficient documentation

## 2018-02-07 MED ORDER — METHYLPREDNISOLONE SODIUM SUCC 125 MG IJ SOLR
125.0000 mg | Freq: Once | INTRAMUSCULAR | Status: DC
  Filled 2018-02-07: qty 2

## 2018-02-07 MED ORDER — PREDNISONE 50 MG PO TABS: ORAL_TABLET | ORAL | 0 refills | Status: DC

## 2018-02-07 MED ORDER — DIPHENHYDRAMINE HCL 25 MG PO CAPS: 25.0000 mg | ORAL_CAPSULE | ORAL | 0 refills | Status: DC | PRN

## 2018-02-07 MED ORDER — CLOTRIMAZOLE-BETAMETHASONE 1-0.05 % EX CREA: 1.0000 "application " | TOPICAL_CREAM | Freq: Two times a day (BID) | CUTANEOUS | 0 refills | Status: DC

## 2018-02-07 NOTE — ED Notes (Signed)
Pt c/o itchy scaly red raised rash to the left upper abd for the past 2 weeks, states it started out like "a ringworm looking little patch". States he applied alcohol to the area and seemed to go away at first but then returned and grown to a larger hand sized area now..Marland Kitchen

## 2018-02-07 NOTE — ED Provider Notes (Signed)
[facility] Emergency Department Provider Note  ____________________________________________  Time seen: Approximately 7:16 AM  I have reviewed the triage vital signs and the nursing notes.   HISTORY  Chief Complaint Rash    HPI David Peters is a 37 y.o. male that presents to the emergency department for evaluation of rash to left flank for 1 week.  Rash itches and is not painful.  It started as one small spot and then started spreading up toward his armpits and down toward his hip.  Patient has been applying rubbing alcohol to rash.  No one else has a rash.  No known allergies.  No new lotions, soaps, detergents, foods, medications.  No insect contacts.  No recent illness.  No fever, chills, nausea, vomiting, abdominal pain.  History reviewed. No pertinent past medical history.  There are no active problems to display for this patient.   History reviewed. No pertinent surgical history.  Prior to Admission medications   Medication Sig Start Date End Date Taking? Authorizing Provider  brompheniramine-pseudoephedrine-DM 30-2-10 MG/5ML syrup Take 10 mLs by mouth 4 (four) times daily as needed. 09/16/16   Hagler, Jami L, PA-C  clotrimazole-betamethasone (LOTRISONE) cream Apply 1 application topically 2 (two) times daily. 02/07/18   Enid DerryWagner, Bennette Hasty, PA-C  cyclobenzaprine (FLEXERIL) 10 MG tablet Take 1 tablet (10 mg total) by mouth 3 (three) times daily as needed. 04/03/17   Joni ReiningSmith, Ronald K, PA-C  diphenhydrAMINE (BENADRYL) 25 mg capsule Take 1 capsule (25 mg total) by mouth every 4 (four) hours as needed. 02/07/18 02/07/19  Enid DerryWagner, Jazmin Vensel, PA-C  fluticasone (FLONASE) 50 MCG/ACT nasal spray Place 2 sprays into both nostrils daily. 09/16/16   Hagler, Jami L, PA-C  furosemide (LASIX) 20 MG tablet Take 1 tablet (20 mg total) by mouth daily. 05/07/15   Beers, Charmayne Sheerharles M, PA-C  ibuprofen (ADVIL,MOTRIN) 800 MG tablet Take 1 tablet (800 mg total) by mouth every 8 (eight) hours  as needed for moderate pain. 03/23/16   Irean HongSung, Jade J, MD  naproxen (NAPROSYN) 500 MG tablet Take 1 tablet (500 mg total) by mouth 2 (two) times daily with a meal. 04/03/17   Joni ReiningSmith, Ronald K, PA-C  predniSONE (DELTASONE) 50 MG tablet Take 1 tablet per day 02/07/18   Enid DerryWagner, Jartavious Mckimmy, PA-C    Allergies Patient has no known allergies.  History reviewed. No pertinent family history.  Social History Social History   Tobacco Use  . Smoking status: Current Every Day Smoker    Packs/day: 0.50    Years: 5.00    Pack years: 2.50    Types: Cigarettes  . Smokeless tobacco: Never Used  Substance Use Topics  . Alcohol use: Yes    Comment: pt states he drinks about a pint of liquor on the weekends.   . Drug use: No     Review of Systems  Constitutional: No fever/chills Cardiovascular: No chest pain. Respiratory: No SOB. Gastrointestinal: No abdominal pain.  No nausea, no vomiting.  Musculoskeletal: Negative for musculoskeletal pain. Skin: Negative for abrasions, lacerations, ecchymosis. Positive for rash. Neurological: Negative for numbness or tingling   ____________________________________________   PHYSICAL EXAM:  VITAL SIGNS: ED Triage Vitals  Enc Vitals Group     BP 02/07/18 0704 (!) 142/86     Pulse Rate 02/07/18 0704 75     Resp 02/07/18 0704 18     Temp 02/07/18 0704 97.8 F (36.6 C)     Temp Source 02/07/18 0704 Oral     SpO2 02/07/18 0704 96 %  Weight 02/07/18 0712 220 lb (99.8 kg)     Height 02/07/18 0712 5\' 8"  (1.727 m)     Head Circumference --      Peak Flow --      Pain Score 02/07/18 0712 0     Pain Loc --      Pain Edu? --      Excl. in GC? --      Constitutional: Alert and oriented. Well appearing and in no acute distress. Eyes: Conjunctivae are normal. PERRL. EOMI. Head: Atraumatic. ENT:      Ears:      Nose: No congestion/rhinnorhea.      Mouth/Throat: Mucous membranes are moist.  Neck: No stridor.   Cardiovascular: Good peripheral  circulation. Respiratory: Normal respiratory effort without tachypnea or retractions.  Musculoskeletal: Full range of motion to all extremities. No gross deformities appreciated. Neurologic:  Normal speech and language. No gross focal neurologic deficits are appreciated.  Skin:  Skin is warm, dry and intact. Dry plaques to left side extending from T7-T10. Psychiatric: Mood and affect are normal. Speech and behavior are normal. Patient exhibits appropriate insight and judgement.   ____________________________________________   LABS (all labs ordered are listed, but only abnormal results are displayed)  Labs Reviewed - No data to display ____________________________________________  EKG   ____________________________________________  RADIOLOGY  No results found.  ____________________________________________    PROCEDURES  Procedure(s) performed:    Procedures    Medications  methylPREDNISolone sodium succinate (SOLU-MEDROL) 125 mg/2 mL injection 125 mg (125 mg Intramuscular Refused 02/07/18 0742)     ____________________________________________   INITIAL IMPRESSION / ASSESSMENT AND PLAN / ED COURSE  Pertinent labs & imaging results that were available during my care of the patient were reviewed by me and considered in my medical decision making (see chart for details).  Review of the [place] CSRS was performed in accordance of the [facility] prior to dispensing any controlled drugs.     Patient presented to the emergency department for evaluation of rash. Vital signs and exam are reassuring. Rash is consistent with atopic dermatitis. He will be covered for secondary fungal infection. No signs of bacterial infection. Patient refuses IM solumedrol. Patient will be discharged home with prescriptions for prednisone, clotrimazole-betamethasone, and benadryl. Patient is to follow up with PCP and dermatology as directed. Patient is given ED precautions to return to the ED for any  worsening or new symptoms.     ____________________________________________  FINAL CLINICAL IMPRESSION(S) / ED DIAGNOSES  Final diagnoses:  Rash      NEW MEDICATIONS STARTED DURING THIS VISIT:  ED Discharge Orders        Ordered    predniSONE (DELTASONE) 50 MG tablet     02/07/18 0745    clotrimazole-betamethasone (LOTRISONE) cream  2 times daily     02/07/18 0745    diphenhydrAMINE (BENADRYL) 25 mg capsule  Every 4 hours PRN     02/07/18 0747          This chart was dictated using voice recognition software/Dragon. Despite best efforts to proofread, errors can occur which can change the meaning. Any change was purely unintentional.    Enid Derry, PA-C 02/07/18 1538    Dionne Bucy, MD 02/08/18 [phone]

## 2018-02-07 NOTE — ED Triage Notes (Signed)
Pt has rash to left abdomen under left breast, rash is dried and not draining.  Pt states he has been putting alcohol on it to dry it out.  Rash does not cross dermatone and is not present on the back.

## 2018-07-23 ENCOUNTER — Encounter: Payer: Self-pay | Admitting: Emergency Medicine

## 2018-07-23 ENCOUNTER — Emergency Department
Admission: EM | Admit: 2018-07-23 | Discharge: 2018-07-23 | Disposition: A | Discharge status: Home / Self Care | Payer: Self-pay | Attending: Emergency Medicine | Admitting: Emergency Medicine

## 2018-07-23 DIAGNOSIS — Y998 Other external cause status: Secondary | ICD-10-CM | POA: Insufficient documentation

## 2018-07-23 DIAGNOSIS — F17210 Nicotine dependence, cigarettes, uncomplicated: Secondary | ICD-10-CM | POA: Insufficient documentation

## 2018-07-23 DIAGNOSIS — Y929 Unspecified place or not applicable: Secondary | ICD-10-CM | POA: Insufficient documentation

## 2018-07-23 DIAGNOSIS — S29019A Strain of muscle and tendon of unspecified wall of thorax, initial encounter: Secondary | ICD-10-CM | POA: Insufficient documentation

## 2018-07-23 DIAGNOSIS — Y9389 Activity, other specified: Secondary | ICD-10-CM | POA: Insufficient documentation

## 2018-07-23 DIAGNOSIS — X58XXXA Exposure to other specified factors, initial encounter: Secondary | ICD-10-CM | POA: Insufficient documentation

## 2018-07-23 MED ORDER — KETOROLAC TROMETHAMINE 30 MG/ML IJ SOLN
30.0000 mg | Freq: Once | INTRAMUSCULAR | Status: AC
  Administered 2018-07-23: 30 mg via INTRAMUSCULAR
  Filled 2018-07-23: qty 1

## 2018-07-23 MED ORDER — MELOXICAM 15 MG PO TABS: 15.0000 mg | ORAL_TABLET | Freq: Every day | ORAL | 0 refills | Status: DC

## 2018-07-23 MED ORDER — METHOCARBAMOL 500 MG PO TABS: 500.0000 mg | ORAL_TABLET | Freq: Four times a day (QID) | ORAL | 0 refills | Status: DC | PRN

## 2018-07-23 NOTE — ED Triage Notes (Addendum)
Pt to ED with c/o of upper back pain that has been on going for approx 2 days. Pt throbbing and muscle spasms. Pt ambulatory in triage.

## 2018-07-23 NOTE — Discharge Instructions (Addendum)
Call make an appointment with [facility] clinic for follow-up next week.  Begin taking meloxicam 15 mg 1 daily with food.  Methocarbamol 500 mg 1 tablet 4 times daily as needed for muscle spasms.  You may alternate between ice or heat for your muscle spasms as needed for discomfort.  Do not take muscle relaxant and drive as this causes drowsiness and increase your risk for injury.

## 2018-07-23 NOTE — ED Provider Notes (Signed)
[facility] Emergency Department Provider Note   ____________________________________________   First MD Initiated Contact with Patient 07/23/18 [phone]     (approximate)  I have reviewed the triage vital signs and the nursing notes.   HISTORY  Chief Complaint Back Pain   HPI David Peters is a 37 y.o. male David Peters to the ED with complaint of upper back pain for 2 days.  Patient states that he has a problem with muscle spasms.  He denies any injury.  He states that he was "getting tight" and tried to wash dishes and had to lie down.  He states the next morning he was no better.  He has not taken any over-the-counter medication for his pain.  He states he usually goes to [facility] and is given muscle relaxants which makes things better.  He rates pain as an 8 out of 10.  History reviewed. No pertinent past medical history.  There are no active problems to display for this patient.   History reviewed. No pertinent surgical history.  Prior to Admission medications   Medication Sig Start Date End Date Taking? Authorizing Provider  meloxicam (MOBIC) 15 MG tablet Take 1 tablet (15 mg total) by mouth daily. 07/23/18 07/23/19  Tommi Rumps, PA-C  methocarbamol (ROBAXIN) 500 MG tablet Take 1 tablet (500 mg total) by mouth every 6 (six) hours as needed. 07/23/18   Tommi Rumps, PA-C    Allergies Patient has no known allergies.  History reviewed. No pertinent family history.  Social History Social History   Tobacco Use  . Smoking status: Current Every Day Smoker    Packs/day: 0.50    Years: 5.00    Pack years: 2.50    Types: Cigarettes  . Smokeless tobacco: Never Used  Substance Use Topics  . Alcohol use: Yes    Comment: pt states he drinks about a pint of liquor on the weekends.   . Drug use: No    Review of Systems Constitutional: No fever/chills Eyes: No visual changes. ENT: No sore throat. Cardiovascular: Denies chest  pain. Respiratory: Denies shortness of breath. Musculoskeletal: Positive for upper back pain.  Positive for muscle spasms. Skin: Negative for rash. Neurological: Negative for headaches, focal weakness or numbness.   ____________________________________________   PHYSICAL EXAM:  VITAL SIGNS: ED Triage Vitals  Enc Vitals Group     BP 07/23/18 0836 (!) 153/89     Pulse Rate 07/23/18 0836 86     Resp 07/23/18 0836 18     Temp 07/23/18 0836 98.1 F (36.7 C)     Temp Source 07/23/18 0836 Oral     SpO2 07/23/18 0836 98 %     Weight --      Height 07/23/18 0837 5\' 9"  (1.753 m)     Head Circumference --      Peak Flow --      Pain Score 07/23/18 0836 8     Pain Loc --      Pain Edu? --      Excl. in GC? --    Constitutional: Alert and oriented. Well appearing and in no acute distress. Eyes: Conjunctivae are normal.  Head: Atraumatic. Nose: No congestion/rhinnorhea. Neck: No stridor.  No cervical tenderness on palpation posteriorly.  Range of motion is that restriction. Cardiovascular: Normal rate, regular rhythm. Grossly normal heart sounds.  Good peripheral circulation. Respiratory: Normal respiratory effort.  No retractions. Lungs CTAB. Gastrointestinal: Soft and nontender. No distention.  No CVA tenderness. Musculoskeletal:  Moves upper and lower extremities without any difficulty.  On examination of the back there is no gross deformity and no evidence of injury or skin discoloration.  There is minimal tenderness on palpation of the thoracic and lumbar spine.  There is tenderness on palpation of the parascapular muscles bilaterally.  Range of motion is without restriction or crepitus of the bilateral shoulders.  Skin is intact.  Normal gait was noted. Neurologic:  Normal speech and language. No gross focal neurologic deficits are appreciated. No gait instability. Skin:  Skin is warm, dry and intact. No rash noted. Psychiatric: Mood and affect are normal. Speech and behavior are  normal.  ____________________________________________   LABS (all labs ordered are listed, but only abnormal results are displayed)  Labs Reviewed - No data to display  PROCEDURES  Procedure(s) performed: None  Procedures  Critical Care performed: No  ____________________________________________   INITIAL IMPRESSION / ASSESSMENT AND PLAN / ED COURSE  As part of my medical decision making, I reviewed the following data within the electronic MEDICAL RECORD NUMBER Notes from prior ED visits and [facility] [place] Controlled Substance Database  Patient presents to the ED with complaint of upper back pain for approximately 2 days.  He denies any known injury.  On exam he is tender bilateral parascapular muscles.  No evidence of injury is noted.  Patient was given Toradol 30 mg IM in the department.  Patient was given a prescription for Robaxin 1 every 6 hours as needed for muscle spasms and meloxicam 15 mg 1 daily.  He is encouraged to follow-up with [facility] clinic if any continued problems.  He is encouraged to use moist heat or ice to his back as needed for discomfort. ____________________________________________   FINAL CLINICAL IMPRESSION(S) / ED DIAGNOSES  Final diagnoses:  Thoracic myofascial strain, initial encounter     ED Discharge Orders         Ordered    meloxicam (MOBIC) 15 MG tablet  Daily     07/23/18 0909    methocarbamol (ROBAXIN) 500 MG tablet  Every 6 hours PRN     07/23/18 0909           Note:  This document was prepared using Dragon voice recognition software and may include unintentional dictation errors.    Tommi Rumps, PA-C 07/23/18 1131    Governor Rooks, MD 07/24/18 [phone]

## 2018-07-23 NOTE — ED Notes (Signed)
See triage note  presents with pain to upper back for couple of days   deneis any injury

## 2018-11-19 ENCOUNTER — Emergency Department
Admission: EM | Admit: 2018-11-19 | Discharge: 2018-11-19 | Disposition: A | Discharge status: Home / Self Care | Payer: Self-pay | Attending: Emergency Medicine | Admitting: Emergency Medicine

## 2018-11-19 ENCOUNTER — Encounter: Payer: Self-pay | Admitting: Medical Oncology

## 2018-11-19 DIAGNOSIS — K0501 Acute gingivitis, non-plaque induced: Secondary | ICD-10-CM | POA: Insufficient documentation

## 2018-11-19 DIAGNOSIS — K047 Periapical abscess without sinus: Secondary | ICD-10-CM

## 2018-11-19 DIAGNOSIS — K029 Dental caries, unspecified: Secondary | ICD-10-CM | POA: Insufficient documentation

## 2018-11-19 DIAGNOSIS — K0510 Chronic gingivitis, plaque induced: Secondary | ICD-10-CM

## 2018-11-19 DIAGNOSIS — F17210 Nicotine dependence, cigarettes, uncomplicated: Secondary | ICD-10-CM | POA: Insufficient documentation

## 2018-11-19 MED ORDER — LIDOCAINE VISCOUS HCL 2 % MT SOLN
15.0000 mL | Freq: Once | OROMUCOSAL | Status: AC
  Administered 2018-11-19: 15 mL via OROMUCOSAL
  Filled 2018-11-19: qty 15

## 2018-11-19 MED ORDER — LIDOCAINE VISCOUS HCL 2 % MT SOLN: 5.0000 mL | Freq: Four times a day (QID) | OROMUCOSAL | 0 refills | Status: DC | PRN

## 2018-11-19 MED ORDER — IBUPROFEN 600 MG PO TABS
600.0000 mg | ORAL_TABLET | Freq: Once | ORAL | Status: AC
  Administered 2018-11-19: 600 mg via ORAL
  Filled 2018-11-19: qty 1

## 2018-11-19 MED ORDER — AMOXICILLIN 500 MG PO CAPS: 500.0000 mg | ORAL_CAPSULE | Freq: Three times a day (TID) | ORAL | 0 refills | Status: DC

## 2018-11-19 MED ORDER — IBUPROFEN 800 MG PO TABS: 800.0000 mg | ORAL_TABLET | Freq: Three times a day (TID) | ORAL | 0 refills | Status: DC | PRN

## 2018-11-19 NOTE — ED Triage Notes (Signed)
Rt sided dental pain x 3 days

## 2018-11-19 NOTE — Discharge Instructions (Signed)
Follow-up from list of dental clinics provided °

## 2018-11-19 NOTE — ED Notes (Signed)
C/o dental pain to right side, states has noticed swelling to gums, using goody powder and ibuprofen for pain relief.  States he does not have a Education officer, communitydentist.

## 2018-11-19 NOTE — ED Provider Notes (Signed)
[facility] Emergency Department Provider Note   ____________________________________________   First MD Initiated Contact with Patient 11/19/18 0725     (approximate)  I have reviewed the triage vital signs and the nursing notes.   HISTORY  Chief Complaint Dental Pain    HPI David Peters is a 38 y.o. male patient presents with right-sided dental pain for 3 days.  Patient has history of poor dental hygiene.  Patient state unable to see the dentist secondary to lack of insurance and high co-pay.  Patient denies fever/ chills this complaint.  Patient state gums are swollen.  Patient rates pain as a 10/10.  Patient describes pain is "achy".  No palliative measures over-the-counter anti-inflammatory medications.  History reviewed. No pertinent past medical history.  There are no active problems to display for this patient.   History reviewed. No pertinent surgical history.  Prior to Admission medications   Medication Sig Start Date End Date Taking? Authorizing Provider  amoxicillin (AMOXIL) 500 MG capsule Take 1 capsule (500 mg total) by mouth 3 (three) times daily. 11/19/18   Joni Reining, PA-C  ibuprofen (ADVIL,MOTRIN) 800 MG tablet Take 1 tablet (800 mg total) by mouth every 8 (eight) hours as needed for moderate pain. 11/19/18   Joni Reining, PA-C  lidocaine (XYLOCAINE) 2 % solution Use as directed 5 mLs in the mouth or throat every 6 (six) hours as needed for mouth pain. For oral swish. 11/19/18   Joni Reining, PA-C  meloxicam (MOBIC) 15 MG tablet Take 1 tablet (15 mg total) by mouth daily. 07/23/18 07/23/19  Tommi Rumps, PA-C  methocarbamol (ROBAXIN) 500 MG tablet Take 1 tablet (500 mg total) by mouth every 6 (six) hours as needed. 07/23/18   Tommi Rumps, PA-C    Allergies Patient has no known allergies.  No family history on file.  Social History Social History   Tobacco Use  . Smoking status: Current Every Day Smoker   Packs/day: 0.50    Years: 5.00    Pack years: 2.50    Types: Cigarettes  . Smokeless tobacco: Never Used  Substance Use Topics  . Alcohol use: Yes    Comment: pt states he drinks about a pint of liquor on the weekends.   . Drug use: No    Review of Systems Constitutional: No fever/chills Eyes: No visual changes. ENT: No sore throat.  Dental pain Cardiovascular: Denies chest pain. Respiratory: Denies shortness of breath. Gastrointestinal: No abdominal pain.  No nausea, no vomiting.  No diarrhea.  No constipation. Genitourinary: Negative for dysuria. Musculoskeletal: Negative for back pain. Skin: Negative for rash. Neurological: Negative for headaches, focal weakness or numbness.   ____________________________________________   PHYSICAL EXAM:  VITAL SIGNS: ED Triage Vitals  Enc Vitals Group     BP 11/19/18 0721 (!) 169/111     Pulse Rate 11/19/18 0721 81     Resp --      Temp 11/19/18 0721 97.7 F (36.5 C)     Temp Source 11/19/18 0721 Oral     SpO2 11/19/18 0721 99 %     Weight 11/19/18 0718 218 lb 4.1 oz (99 kg)     Height --      Head Circumference --      Peak Flow --      Pain Score 11/19/18 0718 10     Pain Loc --      Pain Edu? --      Excl. in GC? --  Constitutional: Alert and oriented. Well appearing and in no acute distress. Mouth/Throat: Mucous membranes are moist.  Oropharynx non-erythematous.  Gingiva edema and multiple dental caries. Cardiovascular: Normal rate, regular rhythm. Grossly normal heart sounds.  Good peripheral circulation. Respiratory: Normal respiratory effort.  No retractions. Lungs CTAB. Skin:  Skin is warm, dry and intact. No rash noted. Psychiatric: Mood and affect are normal. Speech and behavior are normal.  ____________________________________________   LABS (all labs ordered are listed, but only abnormal results are displayed)  Labs Reviewed - No data to  display ____________________________________________  EKG   ____________________________________________  RADIOLOGY  ED MD interpretation:    Official radiology report(s): No results found.  ____________________________________________   PROCEDURES  Procedure(s) performed: None  Procedures  Critical Care performed: No  ____________________________________________   INITIAL IMPRESSION / ASSESSMENT AND PLAN / ED COURSE  As part of my medical decision making, I reviewed the following data within the electronic MEDICAL RECORD NUMBER     Dental pain secondary to gingivitis and dental caries.  Patient given discharge care instruction list of dental clinics for follow-up care.  Take medication as directed.      ____________________________________________   FINAL CLINICAL IMPRESSION(S) / ED DIAGNOSES  Final diagnoses:  Infected dental caries  Gingivitis     ED Discharge Orders         Ordered    amoxicillin (AMOXIL) 500 MG capsule  3 times daily     11/19/18 0730    ibuprofen (ADVIL,MOTRIN) 800 MG tablet  Every 8 hours PRN     11/19/18 0730    lidocaine (XYLOCAINE) 2 % solution  Every 6 hours PRN     11/19/18 0730           Note:  This document was prepared using Dragon voice recognition software and may include unintentional dictation errors.    Joni Reining, PA-C 11/19/18 9166    Arnaldo Natal, MD 11/19/18 [phone]

## 2018-12-23 ENCOUNTER — Other Ambulatory Visit: Payer: Self-pay

## 2018-12-23 ENCOUNTER — Encounter: Payer: Self-pay | Admitting: Emergency Medicine

## 2018-12-23 ENCOUNTER — Emergency Department
Admission: EM | Admit: 2018-12-23 | Discharge: 2018-12-23 | Disposition: A | Discharge status: Home / Self Care | Payer: Self-pay | Attending: Emergency Medicine | Admitting: Emergency Medicine

## 2018-12-23 DIAGNOSIS — F17210 Nicotine dependence, cigarettes, uncomplicated: Secondary | ICD-10-CM | POA: Insufficient documentation

## 2018-12-23 DIAGNOSIS — M62830 Muscle spasm of back: Secondary | ICD-10-CM | POA: Insufficient documentation

## 2018-12-23 MED ORDER — NAPROXEN 500 MG PO TABS: 500.0000 mg | ORAL_TABLET | Freq: Two times a day (BID) | ORAL | 0 refills | Status: DC

## 2018-12-23 MED ORDER — BACLOFEN 5 MG PO TABS: 1.0000 | ORAL_TABLET | Freq: Two times a day (BID) | ORAL | 0 refills | Status: DC

## 2018-12-23 NOTE — Discharge Instructions (Signed)
Call make an appointment with your primary care provider at [facility] clinic.  They may also want you to follow-up with a orthopedist for evaluation of your back.  Begin taking medication only as directed.  You may also use heat to your back as needed for discomfort.  Do not drive or operate machinery while taking the baclofen until you are able to determine whether or not this causes drowsiness.

## 2018-12-23 NOTE — ED Notes (Signed)

## 2018-12-23 NOTE — ED Provider Notes (Signed)
[facility] Emergency Department Provider Note  ____________________________________________   None    (approximate)  I have reviewed the triage vital signs and the nursing notes.   HISTORY  Chief Complaint Back Pain  HPI David Peters is a 38 y.o. male presents to the ED with complaint of "back spasms for the last 3 days".  Patient states that he has been seen multiple times in the emergency department and also at [facility] clinic for the same.  He states that at [facility] they give him Percocet which helps with the pain.  He also has taken Flexeril in the past which makes him go to sleep.  He denies any injury and states that he has had problems for "many years".  He denies any urinary symptoms, paresthesias, saddle anesthesias or incontinence of bowel or bladder.  Patient continues to ambulate without any assistance and actually drove himself to the emergency department.  He is also requesting a note to stay out of work.  He rates his pain as a 10/10.       History reviewed. No pertinent past medical history.  There are no active problems to display for this patient.   History reviewed. No pertinent surgical history.  Prior to Admission medications   Medication Sig Start Date End Date Taking? Authorizing Provider  Baclofen 5 MG TABS Take 1 tablet by mouth 2 (two) times daily. 12/23/18   Tommi Rumps, PA-C  naproxen (NAPROSYN) 500 MG tablet Take 1 tablet (500 mg total) by mouth 2 (two) times daily with a meal. 12/23/18   Tommi Rumps, PA-C    Allergies Patient has no known allergies.  No family history on file.  Social History Social History   Tobacco Use  . Smoking status: Current Every Day Smoker    Packs/day: 0.50    Years: 5.00    Pack years: 2.50    Types: Cigarettes  . Smokeless tobacco: Never Used  Substance Use Topics  . Alcohol use: Yes    Comment: pt states he drinks about a pint of liquor on the weekends.   . Drug use:  No    Review of Systems Constitutional: No fever/chills Cardiovascular: Denies chest pain. Respiratory: Denies shortness of breath. Gastrointestinal: No abdominal pain.  No nausea, no vomiting.  Musculoskeletal: Positive for chronic intermittent muscle spasms of the back. Skin: Negative for rash. Neurological: Negative for headaches, focal weakness or numbness. ____________________________________________   PHYSICAL EXAM:  VITAL SIGNS: ED Triage Vitals  Enc Vitals Group     BP 12/23/18 0624 (!) 149/94     Pulse Rate 12/23/18 0624 75     Resp 12/23/18 0624 18     Temp 12/23/18 0624 97.9 F (36.6 C)     Temp Source 12/23/18 0624 Oral     SpO2 12/23/18 0624 97 %     Weight 12/23/18 0624 220 lb (99.8 kg)     Height 12/23/18 0624 5\' 9"  (1.753 m)     Head Circumference --      Peak Flow --      Pain Score 12/23/18 0623 10     Pain Loc --      Pain Edu? --      Excl. in GC? --    Constitutional: Alert and oriented. Well appearing and in no acute distress.  Eyes: Conjunctivae are normal.  Head: Atraumatic. Neck: No stridor.  Hematological/Lymphatic/Immunilogical: No cervical lymphadenopathy. Cardiovascular: Normal rate, regular rhythm. Grossly normal heart sounds.  Good peripheral circulation. Respiratory: Normal respiratory effort.  No retractions. Lungs CTAB. Musculoskeletal: On examination of the back there is no gross deformity noted.  There is diffuse mild tenderness on palpation of thoracic and lumbar spine and paravertebral muscles bilaterally surrounding these areas.  Range of motion is guarded secondary to discomfort.  Patient is able to stand and ambulate without any assistance.  Good muscle strength bilaterally.  Straight leg raises are negative. Neurologic:  Normal speech and language. No gross focal neurologic deficits are appreciated.  Reflexes are 2+ bilaterally.  No gait instability. Skin:  Skin is warm, dry and intact. No rash noted.  No edema or skin  discoloration is noted. Psychiatric: Mood and affect are normal. Speech and behavior are normal.  ____________________________________________   LABS (all labs ordered are listed, but only abnormal results are displayed)  Labs Reviewed - No data to display  PROCEDURES  Procedure(s) performed (including Critical Care):  Procedures   ____________________________________________   INITIAL IMPRESSION / ASSESSMENT AND PLAN / ED COURSE  As part of my medical decision making, I reviewed the following data within the electronic MEDICAL RECORD NUMBER Notes from prior ED visits and [place] Controlled Substance Database  Patient presents to the ED with complaint of muscle spasms to his back that are recurrent and chronic over many years.  He states that most recent episode has been over the last 3 days.  He denies any recent injury.  He states that in the past he has been treated with Flexeril which causes drowsiness.  He also is obtained Percocet from his PCP which is not showing up in the [place] controlled substance database.  Patient was offered Toradol which he refused.  He was given a prescription for baclofen 5 mg 1 twice a day and naproxen 500 mg twice daily.  He is encouraged to follow-up with his PCP or be referred to an orthopedist for evaluation of his persistent intermittent back discomfort.  Patient also requested a note to remain out of work.  ____________________________________________   FINAL CLINICAL IMPRESSION(S) / ED DIAGNOSES  Final diagnoses:  Muscle spasm of back     ED Discharge Orders         Ordered    Baclofen 5 MG TABS  2 times daily     12/23/18 0732    naproxen (NAPROSYN) 500 MG tablet  2 times daily with meals     12/23/18 0732           Note:  This document was prepared using Dragon voice recognition software and may include unintentional dictation errors.    Tommi Rumps, PA-C 12/23/18 0957    Arnaldo Natal, MD 12/23/18 [phone]

## 2018-12-23 NOTE — ED Triage Notes (Signed)
Patient ambulatory to triage with steady gait, without difficulty or distress noted; pt reports "back spasms" x 3 days; denies any known injury but has hx of same

## 2019-04-25 ENCOUNTER — Emergency Department
Admission: EM | Admit: 2019-04-25 | Discharge: 2019-04-25 | Disposition: A | Discharge status: Home / Self Care | Payer: Self-pay | Attending: Emergency Medicine | Admitting: Emergency Medicine

## 2019-04-25 ENCOUNTER — Emergency Department: Payer: Self-pay

## 2019-04-25 ENCOUNTER — Encounter: Payer: Self-pay | Admitting: Emergency Medicine

## 2019-04-25 ENCOUNTER — Other Ambulatory Visit: Payer: Self-pay

## 2019-04-25 DIAGNOSIS — I10 Essential (primary) hypertension: Secondary | ICD-10-CM | POA: Insufficient documentation

## 2019-04-25 DIAGNOSIS — Y999 Unspecified external cause status: Secondary | ICD-10-CM | POA: Insufficient documentation

## 2019-04-25 DIAGNOSIS — Y9368 Activity, volleyball (beach) (court): Secondary | ICD-10-CM | POA: Insufficient documentation

## 2019-04-25 DIAGNOSIS — S93602A Unspecified sprain of left foot, initial encounter: Secondary | ICD-10-CM | POA: Insufficient documentation

## 2019-04-25 DIAGNOSIS — Y929 Unspecified place or not applicable: Secondary | ICD-10-CM | POA: Insufficient documentation

## 2019-04-25 DIAGNOSIS — X501XXA Overexertion from prolonged static or awkward postures, initial encounter: Secondary | ICD-10-CM | POA: Insufficient documentation

## 2019-04-25 DIAGNOSIS — F17210 Nicotine dependence, cigarettes, uncomplicated: Secondary | ICD-10-CM | POA: Insufficient documentation

## 2019-04-25 HISTORY — DX: Essential (primary) hypertension: I10

## 2019-04-25 MED ORDER — NAPROXEN 500 MG PO TABS: 500.0000 mg | ORAL_TABLET | Freq: Two times a day (BID) | ORAL | Status: DC

## 2019-04-25 MED ORDER — NAPROXEN 500 MG PO TABS
500.0000 mg | ORAL_TABLET | Freq: Once | ORAL | Status: AC
  Administered 2019-04-25: 500 mg via ORAL
  Filled 2019-04-25: qty 1

## 2019-04-25 NOTE — ED Notes (Signed)
See triage note  States he twisted left foot couple of days ago  Having pain with some redness and swelling to lateral foot  Good pulses

## 2019-04-25 NOTE — ED Triage Notes (Signed)
Pt reports sprained his left foot playing volley ball yesterday and now painful and swollen. Pt reports pain it to lateral aspect of left foot.

## 2019-04-25 NOTE — ED Provider Notes (Signed)
[facility] Emergency Department Provider Note   ____________________________________________   First MD Initiated Contact with Patient 04/25/19 1140     (approximate)  I have reviewed the triage vital signs and the nursing notes.   HISTORY  Chief Complaint Foot Pain    HPI David Peters is a 38 y.o. male patient complain of left foot plain secondary to playing volleyball yesterday.  Patient initially thought he sprained his foot.  Patient waking this morning with increased pain swelling.  Patient described the pain is "aching".  Patient the pain increased with weightbearing.         Past Medical History:  Diagnosis Date  . Hypertension     There are no active problems to display for this patient.   History reviewed. No pertinent surgical history.  Prior to Admission medications   Medication Sig Start Date End Date Taking? Authorizing Provider  naproxen (NAPROSYN) 500 MG tablet Take 1 tablet (500 mg total) by mouth 2 (two) times daily with a meal. 04/25/19   Sable Feil, PA-C    Allergies Patient has no known allergies.  No family history on file.  Social History Social History   Tobacco Use  . Smoking status: Current Every Day Smoker    Packs/day: 0.50    Years: 5.00    Pack years: 2.50    Types: Cigarettes  . Smokeless tobacco: Never Used  Substance Use Topics  . Alcohol use: Yes    Comment: pt states he drinks about a pint of liquor on the weekends.   . Drug use: No    Review of Systems Constitutional: No fever/chills Eyes: No visual changes. ENT: No sore throat. Cardiovascular: Denies chest pain. Respiratory: Denies shortness of breath. Gastrointestinal: No abdominal pain.  No nausea, no vomiting.  No diarrhea.  No constipation. Genitourinary: Negative for dysuria. Musculoskeletal: Left foot pain. Skin: Negative for rash. Neurological: Negative for headaches, focal weakness or numbness. Endocrine:   Hypertension.  ____________________________________________   PHYSICAL EXAM:  VITAL SIGNS: ED Triage Vitals  Enc Vitals Group     BP 04/25/19 1051 (!) 140/94     Pulse Rate 04/25/19 1051 70     Resp 04/25/19 1051 20     Temp 04/25/19 1051 98.9 F (37.2 C)     Temp Source 04/25/19 1051 Oral     SpO2 04/25/19 1051 96 %     Weight 04/25/19 1050 215 lb (97.5 kg)     Height 04/25/19 1050 5\' 8"  (1.727 m)     Head Circumference --      Peak Flow --      Pain Score 04/25/19 1050 10     Pain Loc --      Pain Edu? --      Excl. in [place]? --    Constitutional: Alert and oriented. Well appearing and in no acute distress. Cardiovascular: Normal rate, regular rhythm. Grossly normal heart sounds.  Good peripheral circulation. Respiratory: Normal respiratory effort.  No retractions. Lungs CTAB. Gastrointestinal: Soft and nontender. No distention. No abdominal bruits. No CVA tenderness. Musculoskeletal: No obvious deformity or edema to the left foot.  Patient is moderate guarding with palpation lateral aspect of the foot.   Neurologic:  Normal speech and language. No gross focal neurologic deficits are appreciated. No gait instability. Skin:  Skin is warm, dry and intact. No rash noted. Psychiatric: Mood and affect are normal. Speech and behavior are normal.  ____________________________________________   LABS (all labs ordered are  listed, but only abnormal results are displayed)  Labs Reviewed - No data to display ____________________________________________  EKG   ____________________________________________  RADIOLOGY  ED MD interpretation:    Official radiology report(s): Dg Foot Complete Left  Result Date: 04/25/2019 CLINICAL DATA:  Fall.  Twisted foot. EXAM: LEFT FOOT - COMPLETE 3+ VIEW COMPARISON:  07/04/2016. FINDINGS: No acute bony or joint abnormality identified. No evidence of fracture or dislocation. No radiopaque foreign body. IMPRESSION: No acute abnormality.  Electronically Signed   By: Maisie Fushomas  Register   On: 04/25/2019 11:43    ____________________________________________   PROCEDURES  Procedure(s) performed (including Critical Care):  Procedures   ____________________________________________   INITIAL IMPRESSION / ASSESSMENT AND PLAN / ED COURSE  As part of my medical decision making, I reviewed the following data within the electronic MEDICAL RECORD NUMBER         Patient presents with left foot pain secondary to a sprain while playing volleyball yesterday.  Patient did pain increased ambulation.  Discussed negative x-ray findings with patient.  Patient given discharge care instructed at the foot was Ace wrapped.  Patient about follow-up PCP if condition persist.   ____________________________________________   FINAL CLINICAL IMPRESSION(S) / ED DIAGNOSES  Final diagnoses:  Sprain of left foot, initial encounter     ED Discharge Orders         Ordered    naproxen (NAPROSYN) 500 MG tablet  2 times daily with meals     04/25/19 1200           Note:  This document was prepared using Dragon voice recognition software and may include unintentional dictation errors.    Joni ReiningSmith, Perline Awe K, PA-C 04/25/19 1203    Minna AntisPaduchowski, Kevin, MD 04/25/19 [phone]

## 2019-04-29 ENCOUNTER — Other Ambulatory Visit: Payer: Self-pay

## 2019-04-29 ENCOUNTER — Telehealth: Payer: Self-pay | Admitting: General Practice

## 2019-04-29 DIAGNOSIS — Z20822 Contact with and (suspected) exposure to covid-19: Secondary | ICD-10-CM

## 2019-04-29 NOTE — Telephone Encounter (Signed)
COVID-19 Testing Request:  Office name - [facility] Requesting Provider - tiffany Contact number - [phone] Fax number - [phone] Reason for request symptomatic

## 2019-04-29 NOTE — Telephone Encounter (Signed)
Patient returned call.  Scheduled him for today at [facility] at 3pm.  Testing protocol reviewed.

## 2019-04-29 NOTE — Telephone Encounter (Signed)
Left voicemail for patient to return call to schedule COVID 19 test from 7a-7p Monday - Friday.  Order placed. 

## 2019-05-05 LAB — NOVEL CORONAVIRUS, NAA: SARS-CoV-2, NAA: NOT DETECTED

## 2019-05-18 DIAGNOSIS — R7611 Nonspecific reaction to tuberculin skin test without active tuberculosis: Secondary | ICD-10-CM

## 2019-05-20 ENCOUNTER — Encounter: Payer: Self-pay | Admitting: Physician Assistant

## 2019-05-20 ENCOUNTER — Ambulatory Visit: Payer: Self-pay | Admitting: Physician Assistant

## 2019-05-20 ENCOUNTER — Other Ambulatory Visit: Payer: Self-pay

## 2019-05-20 DIAGNOSIS — Z113 Encounter for screening for infections with a predominantly sexual mode of transmission: Secondary | ICD-10-CM

## 2019-05-20 LAB — GRAM STAIN

## 2019-05-20 NOTE — Progress Notes (Signed)
Here today for STD screening. Accepts bloodwork. Serina Nichter, RN ° °

## 2019-05-20 NOTE — Progress Notes (Signed)
    STI clinic/screening visit  Subjective:  David Peters is a 38 y.o. male being seen today for an STI screening visit. The patient reports they do not have symptoms.  Patient has the following medical conditions:   Patient Active Problem List   Diagnosis Date Noted  . 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 SEXUALLY TRANSMITTED DISEASE    HPI  Patient reports that he would like a screening today.  Denies any symptoms.   See flowsheet for further details and programmatic requirements.    The following portions of the patient's history were reviewed and updated as appropriate: allergies, current medications, past medical history, past social history, past surgical history and problem list.  Objective:  There were no vitals filed for this visit.  Physical Exam Constitutional:      General: He is not in acute distress.    Appearance: Normal appearance.  HENT:     Head: Normocephalic and atraumatic.     Mouth/Throat:     Mouth: Mucous membranes are moist.     Pharynx: Oropharynx is clear. No oropharyngeal exudate or posterior oropharyngeal erythema.  Neck:     Musculoskeletal: Neck supple.  Pulmonary:     Effort: Pulmonary effort is normal.  Abdominal:     Palpations: Abdomen is soft. There is no mass.     Tenderness: There is no abdominal tenderness. There is no guarding or rebound.  Genitourinary:    Penis: Normal.      Scrotum/Testes: Normal.     Comments: Pubic area without nits, lice, edema, erythema, lesions or inguinal adenopathy. Penis uncircumcised and without discharge at meatus.   Lymphadenopathy:     Cervical: No cervical adenopathy.  Skin:    General: Skin is warm and dry.     Findings: No bruising, erythema, lesion or rash.  Neurological:     Mental Status: He is alert and oriented to person, place, and time.  Psychiatric:        Mood and Affect: Mood normal.        Behavior: Behavior normal.        Thought  Content: Thought content normal.        Judgment: Judgment normal.       Assessment and Plan:  ZEN CEDILLOS is a 38 y.o. male presenting to the [facility] Department for STI screening  1. Screening for STD (sexually transmitted disease) Patient is without symptoms today. Rec condoms with all sex Await test results.  Counseled that RN will call if needs to RTC for any treatment once results are back.  - Gram stain - HIV [facility] - Syphilis Serology, [facility] - Gonococcus culture     No follow-ups on file.  No future appointments.  Jerene Dilling, PA

## 2019-05-24 LAB — GONOCOCCUS CULTURE

## 2019-10-19 ENCOUNTER — Observation Stay: Payer: Self-pay | Admitting: Certified Registered"

## 2019-10-19 ENCOUNTER — Emergency Department: Payer: Self-pay

## 2019-10-19 ENCOUNTER — Observation Stay
Admission: EM | Admit: 2019-10-19 | Discharge: 2019-10-20 | Disposition: A | Discharge status: Home / Self Care | Payer: Self-pay | Attending: Surgery | Admitting: Surgery

## 2019-10-19 ENCOUNTER — Encounter: Payer: Self-pay | Admitting: *Deleted

## 2019-10-19 ENCOUNTER — Encounter
Admission: EM | Disposition: A | Discharge status: Home / Self Care | Payer: Self-pay | Source: Home / Self Care | Attending: Emergency Medicine

## 2019-10-19 ENCOUNTER — Other Ambulatory Visit: Payer: Self-pay

## 2019-10-19 DIAGNOSIS — K8012 Calculus of gallbladder with acute and chronic cholecystitis without obstruction: Principal | ICD-10-CM | POA: Insufficient documentation

## 2019-10-19 DIAGNOSIS — F17210 Nicotine dependence, cigarettes, uncomplicated: Secondary | ICD-10-CM | POA: Insufficient documentation

## 2019-10-19 DIAGNOSIS — K810 Acute cholecystitis: Secondary | ICD-10-CM

## 2019-10-19 DIAGNOSIS — Z20828 Contact with and (suspected) exposure to other viral communicable diseases: Secondary | ICD-10-CM | POA: Insufficient documentation

## 2019-10-19 DIAGNOSIS — I10 Essential (primary) hypertension: Secondary | ICD-10-CM | POA: Insufficient documentation

## 2019-10-19 HISTORY — PX: CHOLECYSTECTOMY: SHX55

## 2019-10-19 LAB — URINALYSIS, COMPLETE (UACMP) WITH MICROSCOPIC
Bacteria, UA: NONE SEEN
Bilirubin Urine: NEGATIVE
Glucose, UA: 50 mg/dL — AB
Hgb urine dipstick: NEGATIVE
Ketones, ur: NEGATIVE mg/dL
Leukocytes,Ua: NEGATIVE
Nitrite: NEGATIVE
Protein, ur: NEGATIVE mg/dL
Specific Gravity, Urine: 1.013 (ref 1.005–1.030)
pH: 7 (ref 5.0–8.0)

## 2019-10-19 LAB — CBC
HCT: 44.6 % (ref 39.0–52.0)
Hemoglobin: 15.4 g/dL (ref 13.0–17.0)
MCH: 30.7 pg (ref 26.0–34.0)
MCHC: 34.5 g/dL (ref 30.0–36.0)
MCV: 89 fL (ref 80.0–100.0)
Platelets: 257 10*3/uL (ref 150–400)
RBC: 5.01 MIL/uL (ref 4.22–5.81)
RDW: 13.6 % (ref 11.5–15.5)
WBC: 12.8 10*3/uL — ABNORMAL HIGH (ref 4.0–10.5)
nRBC: 0 % (ref 0.0–0.2)

## 2019-10-19 LAB — COMPREHENSIVE METABOLIC PANEL
ALT: 14 U/L (ref 0–44)
AST: 15 U/L (ref 15–41)
Albumin: 4 g/dL (ref 3.5–5.0)
Alkaline Phosphatase: 56 U/L (ref 38–126)
Anion gap: 6 (ref 5–15)
BUN: 14 mg/dL (ref 6–20)
CO2: 29 mmol/L (ref 22–32)
Calcium: 8.9 mg/dL (ref 8.9–10.3)
Chloride: 104 mmol/L (ref 98–111)
Creatinine, Ser: 0.8 mg/dL (ref 0.61–1.24)
GFR calc Af Amer: >60 mL/min (ref >60)
GFR calc non Af Amer: >60 mL/min (ref >60)
Glucose, Bld: 168 mg/dL — ABNORMAL HIGH (ref 70–99)
Potassium: 3.5 mmol/L (ref 3.5–5.1)
Sodium: 139 mmol/L (ref 135–145)
Total Bilirubin: 0.7 mg/dL (ref 0.3–1.2)
Total Protein: 7.8 g/dL (ref 6.5–8.1)

## 2019-10-19 LAB — RESPIRATORY PANEL BY RT PCR (FLU A&B, COVID)
Influenza A by PCR: NEGATIVE
Influenza B by PCR: NEGATIVE
SARS Coronavirus 2 by RT PCR: NEGATIVE

## 2019-10-19 LAB — TROPONIN I (HIGH SENSITIVITY): Troponin I (High Sensitivity): 4 ng/L (ref <18)

## 2019-10-19 LAB — LIPASE, BLOOD: Lipase: 34 U/L (ref 11–51)

## 2019-10-19 SURGERY — LAPAROSCOPIC CHOLECYSTECTOMY
Anesthesia: General

## 2019-10-19 MED ORDER — DEXMEDETOMIDINE HCL 200 MCG/2ML IV SOLN
INTRAVENOUS | Status: DC | PRN
  Administered 2019-10-19 (×2): 8 ug via INTRAVENOUS

## 2019-10-19 MED ORDER — ONDANSETRON 4 MG PO TBDP
4.0000 mg | ORAL_TABLET | Freq: Four times a day (QID) | ORAL | Status: DC | PRN
  Filled 2019-10-19: qty 1

## 2019-10-19 MED ORDER — FENTANYL CITRATE (PF) 100 MCG/2ML IJ SOLN
INTRAMUSCULAR | Status: DC | PRN
  Administered 2019-10-19 (×2): 100 ug via INTRAVENOUS
  Administered 2019-10-19: 50 ug via INTRAVENOUS

## 2019-10-19 MED ORDER — ACETAMINOPHEN 500 MG PO TABS: 1000.0000 mg | ORAL_TABLET | Freq: Four times a day (QID) | ORAL | Status: DC | PRN

## 2019-10-19 MED ORDER — HYDROMORPHONE HCL 1 MG/ML IJ SOLN: 0.5000 mg | INTRAMUSCULAR | Status: DC | PRN

## 2019-10-19 MED ORDER — ONDANSETRON HCL 4 MG/2ML IJ SOLN
4.0000 mg | Freq: Once | INTRAMUSCULAR | Status: AC
  Administered 2019-10-19: 4 mg via INTRAVENOUS
  Filled 2019-10-19: qty 2

## 2019-10-19 MED ORDER — ACETAMINOPHEN 10 MG/ML IV SOLN
INTRAVENOUS | Status: DC | PRN
  Administered 2019-10-19: 1000 mg via INTRAVENOUS

## 2019-10-19 MED ORDER — ENOXAPARIN SODIUM 40 MG/0.4ML ~~LOC~~ SOLN: 40.0000 mg | SUBCUTANEOUS | Status: DC

## 2019-10-19 MED ORDER — MIDAZOLAM HCL 2 MG/2ML IJ SOLN
INTRAMUSCULAR | Status: AC
  Filled 2019-10-19: qty 2

## 2019-10-19 MED ORDER — ONDANSETRON HCL 4 MG/2ML IJ SOLN
INTRAMUSCULAR | Status: DC | PRN
  Administered 2019-10-19: 4 mg via INTRAVENOUS

## 2019-10-19 MED ORDER — ACETAMINOPHEN 10 MG/ML IV SOLN
INTRAVENOUS | Status: AC
  Filled 2019-10-19: qty 100

## 2019-10-19 MED ORDER — PIPERACILLIN-TAZOBACTAM 3.375 G IVPB
3.3750 g | Freq: Three times a day (TID) | INTRAVENOUS | Status: DC
  Administered 2019-10-19 – 2019-10-20 (×3): 3.375 g via INTRAVENOUS
  Filled 2019-10-19 (×3): qty 50

## 2019-10-19 MED ORDER — ROCURONIUM BROMIDE 100 MG/10ML IV SOLN
INTRAVENOUS | Status: DC | PRN
  Administered 2019-10-19: 10 mg via INTRAVENOUS
  Administered 2019-10-19: 40 mg via INTRAVENOUS

## 2019-10-19 MED ORDER — FENTANYL CITRATE (PF) 100 MCG/2ML IJ SOLN: 25.0000 ug | INTRAMUSCULAR | Status: DC | PRN

## 2019-10-19 MED ORDER — KETOROLAC TROMETHAMINE 30 MG/ML IJ SOLN
30.0000 mg | Freq: Four times a day (QID) | INTRAMUSCULAR | Status: DC
  Administered 2019-10-19 – 2019-10-20 (×3): 30 mg via INTRAVENOUS
  Filled 2019-10-19 (×3): qty 1

## 2019-10-19 MED ORDER — DEXAMETHASONE SODIUM PHOSPHATE 10 MG/ML IJ SOLN
INTRAMUSCULAR | Status: DC | PRN
  Administered 2019-10-19: 10 mg via INTRAVENOUS

## 2019-10-19 MED ORDER — ONDANSETRON HCL 4 MG/2ML IJ SOLN: 4.0000 mg | Freq: Four times a day (QID) | INTRAMUSCULAR | Status: DC | PRN

## 2019-10-19 MED ORDER — IOHEXOL 350 MG/ML SOLN
75.0000 mL | Freq: Once | INTRAVENOUS | Status: AC | PRN
  Administered 2019-10-19: 75 mL via INTRAVENOUS

## 2019-10-19 MED ORDER — PANTOPRAZOLE SODIUM 40 MG IV SOLR
40.0000 mg | Freq: Every day | INTRAVENOUS | Status: DC
  Administered 2019-10-19: 22:00:00 40 mg via INTRAVENOUS
  Filled 2019-10-19: qty 40

## 2019-10-19 MED ORDER — PROPOFOL 10 MG/ML IV BOLUS
INTRAVENOUS | Status: AC
  Filled 2019-10-19: qty 20

## 2019-10-19 MED ORDER — ONDANSETRON HCL 4 MG/2ML IJ SOLN: 4.0000 mg | Freq: Once | INTRAMUSCULAR | Status: DC | PRN

## 2019-10-19 MED ORDER — SODIUM CHLORIDE 0.9% FLUSH: 3.0000 mL | Freq: Once | INTRAVENOUS | Status: DC

## 2019-10-19 MED ORDER — MORPHINE SULFATE (PF) 4 MG/ML IV SOLN
4.0000 mg | Freq: Once | INTRAVENOUS | Status: AC
  Administered 2019-10-19: 4 mg via INTRAVENOUS
  Filled 2019-10-19: qty 1

## 2019-10-19 MED ORDER — FAMOTIDINE IN NACL 20-0.9 MG/50ML-% IV SOLN
20.0000 mg | Freq: Once | INTRAVENOUS | Status: AC
  Administered 2019-10-19: 20 mg via INTRAVENOUS
  Filled 2019-10-19: qty 50

## 2019-10-19 MED ORDER — EPINEPHRINE PF 1 MG/ML IJ SOLN
INTRAMUSCULAR | Status: AC
  Filled 2019-10-19: qty 1

## 2019-10-19 MED ORDER — SUCCINYLCHOLINE CHLORIDE 20 MG/ML IJ SOLN
INTRAMUSCULAR | Status: DC | PRN
  Administered 2019-10-19: 120 mg via INTRAVENOUS

## 2019-10-19 MED ORDER — BUPIVACAINE-EPINEPHRINE (PF) 0.25% -1:200000 IJ SOLN
INTRAMUSCULAR | Status: DC | PRN
  Administered 2019-10-19: 30 mL

## 2019-10-19 MED ORDER — LIDOCAINE HCL (PF) 2 % IJ SOLN
INTRAMUSCULAR | Status: AC
  Filled 2019-10-19: qty 5

## 2019-10-19 MED ORDER — FENTANYL CITRATE (PF) 100 MCG/2ML IJ SOLN
INTRAMUSCULAR | Status: AC
  Filled 2019-10-19: qty 2

## 2019-10-19 MED ORDER — POLYETHYLENE GLYCOL 3350 17 G PO PACK: 17.0000 g | PACK | Freq: Every day | ORAL | Status: DC | PRN

## 2019-10-19 MED ORDER — SUCCINYLCHOLINE CHLORIDE 20 MG/ML IJ SOLN
INTRAMUSCULAR | Status: AC
  Filled 2019-10-19: qty 1

## 2019-10-19 MED ORDER — OXYCODONE HCL 5 MG PO TABS: 5.0000 mg | ORAL_TABLET | ORAL | Status: DC | PRN

## 2019-10-19 MED ORDER — PROPOFOL 10 MG/ML IV BOLUS
INTRAVENOUS | Status: DC | PRN
  Administered 2019-10-19: 170 mg via INTRAVENOUS

## 2019-10-19 MED ORDER — MIDAZOLAM HCL 2 MG/2ML IJ SOLN
INTRAMUSCULAR | Status: DC | PRN
  Administered 2019-10-19: 2 mg via INTRAVENOUS

## 2019-10-19 MED ORDER — ROCURONIUM BROMIDE 50 MG/5ML IV SOLN
INTRAVENOUS | Status: AC
  Filled 2019-10-19: qty 1

## 2019-10-19 MED ORDER — BUPIVACAINE HCL (PF) 0.25 % IJ SOLN
INTRAMUSCULAR | Status: AC
  Filled 2019-10-19: qty 30

## 2019-10-19 MED ORDER — FENTANYL CITRATE (PF) 250 MCG/5ML IJ SOLN
INTRAMUSCULAR | Status: AC
  Filled 2019-10-19: qty 5

## 2019-10-19 MED ORDER — SUGAMMADEX SODIUM 200 MG/2ML IV SOLN
INTRAVENOUS | Status: AC
  Filled 2019-10-19: qty 2

## 2019-10-19 MED ORDER — PHENYLEPHRINE HCL (PRESSORS) 10 MG/ML IV SOLN
INTRAVENOUS | Status: DC | PRN
  Administered 2019-10-19 (×2): 100 ug via INTRAVENOUS

## 2019-10-19 MED ORDER — LACTATED RINGERS IV SOLN
125.0000 mL/h | INTRAVENOUS | Status: DC
  Administered 2019-10-19 – 2019-10-20 (×2): 125 mL/h via INTRAVENOUS

## 2019-10-19 MED ORDER — LIDOCAINE HCL (CARDIAC) PF 100 MG/5ML IV SOSY
PREFILLED_SYRINGE | INTRAVENOUS | Status: DC | PRN
  Administered 2019-10-19: 100 mg via INTRAVENOUS

## 2019-10-19 MED ORDER — SUGAMMADEX SODIUM 200 MG/2ML IV SOLN
INTRAVENOUS | Status: DC | PRN
  Administered 2019-10-19: 200 mg via INTRAVENOUS

## 2019-10-19 SURGICAL SUPPLY — 46 items
APPLIER CLIP 5 13 M/L LIGAMAX5 (MISCELLANEOUS) ×3
BLADE SURG 15 STRL LF DISP TIS (BLADE) ×1 IMPLANT
BLADE SURG 15 STRL SS (BLADE) ×2
CANISTER SUCT 1200ML W/VALVE (MISCELLANEOUS) ×3 IMPLANT
CATH CHOLANGI 4FR 420404F (CATHETERS) IMPLANT
CHLORAPREP W/TINT 26 (MISCELLANEOUS) ×3 IMPLANT
CLIP APPLIE 5 13 M/L LIGAMAX5 (MISCELLANEOUS) ×1 IMPLANT
CONRAY 60ML FOR OR (MISCELLANEOUS) IMPLANT
COVER WAND RF STERILE (DRAPES) ×3 IMPLANT
DERMABOND ADVANCED (GAUZE/BANDAGES/DRESSINGS) ×2
DERMABOND ADVANCED .7 DNX12 (GAUZE/BANDAGES/DRESSINGS) ×1 IMPLANT
DRAPE C-ARM XRAY 36X54 (DRAPES) IMPLANT
ELECT CAUTERY BLADE TIP 2.5 (TIP) ×3
ELECT REM PT RETURN 9FT ADLT (ELECTROSURGICAL) ×3
ELECTRODE CAUTERY BLDE TIP 2.5 (TIP) ×1 IMPLANT
ELECTRODE REM PT RTRN 9FT ADLT (ELECTROSURGICAL) ×1 IMPLANT
GLOVE SURG SYN 7.0 (GLOVE) ×3 IMPLANT
GLOVE SURG SYN 7.5  E (GLOVE) ×2
GLOVE SURG SYN 7.5 E (GLOVE) ×1 IMPLANT
GOWN STRL REUS W/ TWL LRG LVL3 (GOWN DISPOSABLE) ×3 IMPLANT
GOWN STRL REUS W/TWL LRG LVL3 (GOWN DISPOSABLE) ×6
IRRIGATION STRYKERFLOW (MISCELLANEOUS) ×1 IMPLANT
IRRIGATOR STRYKERFLOW (MISCELLANEOUS) ×3
IV CATH ANGIO 12GX3 LT BLUE (NEEDLE) IMPLANT
IV NS 1000ML (IV SOLUTION) ×2
IV NS 1000ML BAXH (IV SOLUTION) ×1 IMPLANT
JACKSON PRATT 10 (INSTRUMENTS) IMPLANT
L-HOOK LAP DISP 36CM (ELECTROSURGICAL) ×3
LABEL OR SOLS (LABEL) ×3 IMPLANT
LHOOK LAP DISP 36CM (ELECTROSURGICAL) ×1 IMPLANT
NEEDLE HYPO 22GX1.5 SAFETY (NEEDLE) ×6 IMPLANT
PACK LAP CHOLECYSTECTOMY (MISCELLANEOUS) ×3 IMPLANT
PENCIL ELECTRO HAND CTR (MISCELLANEOUS) ×3 IMPLANT
POUCH SPECIMEN RETRIEVAL 10MM (ENDOMECHANICALS) ×3 IMPLANT
SCISSORS METZENBAUM CVD 33 (INSTRUMENTS) ×3 IMPLANT
SET TUBE SMOKE EVAC HIGH FLOW (TUBING) ×3 IMPLANT
SLEEVE ADV FIXATION 5X100MM (TROCAR) ×9 IMPLANT
SPONGE VERSALON 4X4 4PLY (MISCELLANEOUS) IMPLANT
SUT MNCRL 4-0 (SUTURE) ×2
SUT MNCRL 4-0 27XMFL (SUTURE) ×1
SUT VIC AB 3-0 SH 27 (SUTURE) ×2
SUT VIC AB 3-0 SH 27X BRD (SUTURE) ×1 IMPLANT
SUT VICRYL 0 AB UR-6 (SUTURE) ×6 IMPLANT
SUTURE MNCRL 4-0 27XMF (SUTURE) ×1 IMPLANT
TROCAR BALLN GELPORT 12X130M (ENDOMECHANICALS) ×3 IMPLANT
TROCAR Z-THREAD OPTICAL 5X100M (TROCAR) ×3 IMPLANT

## 2019-10-19 NOTE — ED Notes (Signed)
Pt reports awoke with pressure like pain to his abd that went into his back. MD in room with RN to assess pt. Pt c/o pain with palpation and states pain when he moves around as well.

## 2019-10-19 NOTE — Op Note (Addendum)
  Procedure Date:  10/19/2019  Pre-operative Diagnosis:   Acute cholecystitis  Post-operative Diagnosis:  Acute cholecystitis  Procedure:  Laparoscopic cholecystectomy  Surgeon:  Melvyn Neth, MD  Anesthesia:  General endotracheal  Estimated Blood Loss:  20 ml  Specimens:  gallbladder  Complications:  None  Findings:  Inflamed gallbladder with hydrops  Indications for Procedure:  This is a 38 y.o. male who presents with abdominal pain and workup revealing acute cholecystitis.  The benefits, complications, treatment options, and expected outcomes were discussed with the patient. The risks of bleeding, infection, recurrence of symptoms, failure to resolve symptoms, bile duct damage, bile duct leak, retained common bile duct stone, bowel injury, and need for further procedures were all discussed with the patient and he was willing to proceed.  Description of Procedure: The patient was correctly identified in the preoperative area and brought into the operating room.  The patient was placed supine with VTE prophylaxis in place.  Appropriate time-outs were performed.  Anesthesia was induced and the patient was intubated.  Appropriate antibiotics were infused.  The abdomen was prepped and draped in a sterile fashion. An infraumbilical incision was made. A cutdown technique was used to enter the abdominal cavity without injury, and a Hasson trocar was inserted.  Pneumoperitoneum was obtained with appropriate opening pressures.  A 5-mm port was placed in the subxiphoid area and two 5-mm ports were placed in the right upper quadrant under direct visualization.  The gallbladder was identified.  It was very distended and inflamed.  The fundus was grasped and retracted cephalad.  Adhesions were lysed bluntly and with electrocautery. The infundibulum was grasped and retracted laterally, exposing the peritoneum overlying the gallbladder.  This was incised with electrocautery and extended on either  side of the gallbladder.  The cystic duct and cystic artery were clearly identified and bluntly dissected.  Both were clipped twice proximally and once distally, cutting in between.  The gallbladder was taken from the gallbladder fossa in a retrograde fashion with electrocautery. The gallbladder was placed in an Endocatch bag. The liver bed was inspected and any bleeding was controlled with electrocautery. The right upper quadrant was then inspected again revealing intact clips, no bleeding, and no ductal injury.  The area was thoroughly irrigated.  The 5 mm ports were removed under direct visualization and the Hasson trocar was removed.  The Endocatch bag was brought out via the umbilical incision. The fascial opening was closed using 0 vicryl suture.  Local anesthetic was infused in all incisions and the incisions were closed with 3-0 Vicryl and 4-0 Monocryl.  The wounds were cleaned and sealed with DermaBond.  The patient was emerged from anesthesia and extubated and brought to the recovery room for further management.  The patient tolerated the procedure well and all counts were correct at the end of the case.   Melvyn Neth, MD

## 2019-10-19 NOTE — ED Notes (Signed)
Pt back from CT. Call bell in reach. No concerns at this time.

## 2019-10-19 NOTE — ED Notes (Signed)
Pt reports that he is going to wait outside until ready to be seen

## 2019-10-19 NOTE — ED Provider Notes (Signed)
[facility] Emergency Department Provider Note       Time seen: ----------------------------------------- 7:50 AM on 10/19/2019 -----------------------------------------   I have reviewed the triage vital signs and the nursing notes.  HISTORY   Chief Complaint Abdominal Pain    HPI David Peters is a 38 y.o. male with a history of hypertension who presents to the ED for upper abdominal pain that radiates to the back since he woke up this morning.  He describes it as spasm-like.  Was associated with one episode of vomiting.  He took indigestion medicine without any relief.  Discomfort is 10 out of 10.  Past Medical History:  Diagnosis Date  . Hypertension     Patient Active Problem List   Diagnosis Date Noted  . 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 No pertinent surgical history.  Allergies Patient has no known allergies.  Social History Social History   Tobacco Use  . Smoking status: Current Every Day Smoker    Packs/day: 0.50    Years: 5.00    Pack years: 2.50    Types: Cigarettes  . Smokeless tobacco: Never Used  Substance Use Topics  . Alcohol use: Yes    Comment: pt states he drinks about a pint of liquor on the weekends.   . Drug use: No   Review of Systems Constitutional: Negative for fever. Cardiovascular: Negative for chest pain. Respiratory: Negative for shortness of breath. Gastrointestinal: Positive for abdominal pain, vomiting Musculoskeletal: Positive for back pain Skin: Negative for rash. Neurological: Negative for headaches, focal weakness or numbness.  All systems negative/normal/unremarkable except as stated in the HPI  ____________________________________________   PHYSICAL EXAM:  VITAL SIGNS: ED Triage Vitals  Enc Vitals Group     BP 10/19/19 0433 (!) 176/101     Pulse Rate 10/19/19 0433 75     Resp 10/19/19 0433 18     Temp --      Temp src --      SpO2 10/19/19 0433 100 %     Weight --       Height --      Head Circumference --      Peak Flow --      Pain Score 10/19/19 0434 10     Pain Loc --      Pain Edu? --      Excl. in [place]? --     Constitutional: Alert and oriented.  Mild distress Eyes: Conjunctivae are normal. Normal extraocular movements. ENT      Head: Normocephalic and atraumatic.      Nose: No congestion/rhinnorhea.      Mouth/Throat: Mucous membranes are moist.      Neck: No stridor. Cardiovascular: Normal rate, regular rhythm. No murmurs, rubs, or gallops. Respiratory: Normal respiratory effort without tachypnea nor retractions. Breath sounds are clear and equal bilaterally. No wheezes/rales/rhonchi. Gastrointestinal: Epigastric and diffuse upper abdominal tenderness, hypoactive bowel sounds Musculoskeletal: Nontender with normal range of motion in extremities. No lower extremity tenderness nor edema. Neurologic:  Normal speech and language. No gross focal neurologic deficits are appreciated.  Skin:  Skin is warm, dry and intact. No rash noted. Psychiatric: Mood and affect are normal. Speech and behavior are normal.  ____________________________________________  EKG: Interpreted by me.  Sinus rhythm with first-degree AV block, rate of 71 bpm, prolonged PR interval, normal QT  ____________________________________________  ED COURSE:  As part of my medical decision making, I reviewed the following data within the [facility]  History obtained from family if available, nursing notes, old chart and ekg, as well as notes from prior ED visits. Patient presented for severe upper abdominal pain with one episode of vomiting, we will assess with labs and imaging as indicated at this time.   Procedures  David Peters was evaluated in Emergency Department on 10/19/2019 for the symptoms described in the history of present illness. He was evaluated in the context of the global COVID-19 pandemic, which necessitated consideration that the patient  might be at risk for infection with the SARS-CoV-2 virus that causes COVID-19. Institutional protocols and algorithms that pertain to the evaluation of patients at risk for COVID-19 are in a state of rapid change based on information released by regulatory bodies including the CDC and federal and state organizations. These policies and algorithms were followed during the patient's care in the ED.  ____________________________________________   LABS (pertinent positives/negatives)  Labs Reviewed  COMPREHENSIVE METABOLIC PANEL - Abnormal; Notable for the following components:      Result Value   Glucose, Bld 168 (*)    All other components within normal limits  CBC - Abnormal; Notable for the following components:   WBC 12.8 (*)    All other components within normal limits  URINALYSIS, COMPLETE (UACMP) WITH MICROSCOPIC - Abnormal; Notable for the following components:   Color, Urine STRAW (*)    APPearance CLEAR (*)    Glucose, UA 50 (*)    All other components within normal limits  LIPASE, BLOOD  TROPONIN I (HIGH SENSITIVITY)    RADIOLOGY Images were viewed by me  CT dissection protocol IMPRESSION:  1. Normal contour and caliber of the thoracic and abdominal aorta.  No evidence of aneurysm, dissection, or other acute aortic  pathology. 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 Aortic  Atherosclerosis (ICD10-I70.0).  2. Cholelithiasis with gallbladder wall thickening and hyperemia of  the adjacent liver parenchyma, suspicious for acute cholecystitis.  3. No biliary ductal dilatation or radiopaque calculus appreciated  in the common bile duct.  4. Hepatic steatosis.  ____________________________________________   DIFFERENTIAL DIAGNOSIS   GERD, gastritis, peptic ulcer disease, cholecystitis, biliary colic, dissection  FINAL ASSESSMENT AND PLAN  Acute cholecystitis   Plan: The patient had presented for  abdominal pain and vomiting. Patient's labs revealed mild leukocytosis but were otherwise unremarkable. Patient's imaging was concerning for acute cholecystitis.  Patient remains very tender in the right upper quadrant.  Pain was improved after morphine.  I will discuss with general surgery.   Ulice Dash, MD    Note: This note was generated in part or whole with voice recognition software. Voice recognition is usually quite accurate but there are transcription errors that can and very often do occur. I apologize for any typographical errors that were not detected and corrected.     Emily Filbert, MD 10/19/19 [phone]

## 2019-10-19 NOTE — H&P (Signed)
Date of Admission:  10/19/2019  Reason for Admission:  Acute cholecystitis  History of Present Illness: David Peters is a 38 y.o. male presenting with a one day history of epigastric and right upper quadrant pain.  Patient reports that about 3 AM today, he woke up with severe chest pain and epigastric pain.  The pain radiated to the right upper quadrant and to his back.  He was nauseous and had two episodes of emesis.  His symptoms were not improving through the night and he presented to the ED this morning.  His initial workup included CTA which showed a distended, gallbladder with significant wall thickening and full of stones. This was confirmed on ultrasound.  His LFTs were all normal and his WBC was only mildly elevated to 12.8.    Upon my evaluation, he was pain free after getting a dose of morphine and a dose of toradol.    Past Medical History: Past Medical History:  Diagnosis Date  . Hypertension      Past Surgical History: --None  Home Medications: Prior to Admission medications   Medication Sig Start Date End Date Taking? Authorizing Provider  naproxen (NAPROSYN) 500 MG tablet Take 1 tablet (500 mg total) by mouth 2 (two) times daily with a meal. Patient not taking: Reported on 10/19/2019 04/25/19   Joni ReiningSmith, Ronald K, PA-C    Allergies: No Known Allergies  Social History:  reports that he has been smoking cigarettes. He has a 2.50 pack-year smoking history. He has never used smokeless tobacco. He reports current alcohol use. He reports that he does not use drugs.   Family History: Has a relative with prior cholecystectomy  Review of Systems: Review of Systems  Constitutional: Negative for chills and fever.  HENT: Negative for hearing loss.   Respiratory: Negative for shortness of breath.   Cardiovascular: Positive for chest pain.  Gastrointestinal: Positive for abdominal pain, nausea and vomiting. Negative for constipation and diarrhea.  Genitourinary: Negative  for dysuria.  Musculoskeletal: Negative for myalgias.  Skin: Negative for rash.  Neurological: Negative for dizziness.  Psychiatric/Behavioral: Negative for depression.    Physical Exam BP (!) 139/93   Pulse 65   Temp 97.7 F (36.5 C) (Temporal)   Resp 16   Ht 5\' 9"  (1.753 m)   Wt 97.5 kg   SpO2 100%   BMI 31.75 kg/m  CONSTITUTIONAL: No acute distress HEENT:  Normocephalic, atraumatic, extraocular motion intact. NECK: Trachea is midline, and there is no jugular venous distension.  RESPIRATORY:  Lungs are clear, and breath sounds are equal bilaterally. Normal respiratory effort without pathologic use of accessory muscles. CARDIOVASCULAR: Heart is regular without murmurs, gallops, or rubs. GI: The abdomen is soft, non-distended, currently non-tender to palpation.  Negative Murphy's sign.  MUSCULOSKELETAL:  Normal muscle strength and tone in all four extremities.  No peripheral edema or cyanosis. SKIN: Skin turgor is normal. There are no pathologic skin lesions.  NEUROLOGIC:  Motor and sensation is grossly normal.  Cranial nerves are grossly intact. PSYCH:  Alert and oriented to person, place and time. Affect is normal.  Laboratory Analysis: Results for orders placed or performed during the hospital encounter of 10/19/19 (from the past 24 hour(s))  Lipase, blood     Status: None   Collection Time: 10/19/19  4:44 AM  Result Value Ref Range   Lipase 34 11 - 51 U/L  Comprehensive metabolic panel     Status: Abnormal   Collection Time: 10/19/19  4:44 AM  Result  Value Ref Range   Sodium 139 135 - 145 mmol/L   Potassium 3.5 3.5 - 5.1 mmol/L   Chloride 104 98 - 111 mmol/L   CO2 29 22 - 32 mmol/L   Glucose, Bld 168 (H) 70 - 99 mg/dL   BUN 14 6 - 20 mg/dL   Creatinine, Ser 0.80 0.61 - 1.24 mg/dL   Calcium 8.9 8.9 - 10.3 mg/dL   Total Protein 7.8 6.5 - 8.1 g/dL   Albumin 4.0 3.5 - 5.0 g/dL   AST 15 15 - 41 U/L   ALT 14 0 - 44 U/L   Alkaline Phosphatase 56 38 - 126 U/L   Total  Bilirubin 0.7 0.3 - 1.2 mg/dL   GFR calc non Af Amer >60 >60 mL/min   GFR calc Af Amer >60 >60 mL/min   Anion gap 6 5 - 15  CBC     Status: Abnormal   Collection Time: 10/19/19  4:44 AM  Result Value Ref Range   WBC 12.8 (H) 4.0 - 10.5 K/uL   RBC 5.01 4.22 - 5.81 MIL/uL   Hemoglobin 15.4 13.0 - 17.0 g/dL   HCT 44.6 39.0 - 52.0 %   MCV 89.0 80.0 - 100.0 fL   MCH 30.7 26.0 - 34.0 pg   MCHC 34.5 30.0 - 36.0 g/dL   RDW 13.6 11.5 - 15.5 %   Platelets 257 150 - 400 K/uL   nRBC 0.0 0.0 - 0.2 %  Troponin I (High Sensitivity)     Status: None   Collection Time: 10/19/19  4:44 AM  Result Value Ref Range   Troponin I (High Sensitivity) 4 <18 ng/L  Urinalysis, Complete w Microscopic     Status: Abnormal   Collection Time: 10/19/19  4:44 AM  Result Value Ref Range   Color, Urine STRAW (A) YELLOW   APPearance CLEAR (A) CLEAR   Specific Gravity, Urine 1.013 1.005 - 1.030   pH 7.0 5.0 - 8.0   Glucose, UA 50 (A) NEGATIVE mg/dL   Hgb urine dipstick NEGATIVE NEGATIVE   Bilirubin Urine NEGATIVE NEGATIVE   Ketones, ur NEGATIVE NEGATIVE mg/dL   Protein, ur NEGATIVE NEGATIVE mg/dL   Nitrite NEGATIVE NEGATIVE   Leukocytes,Ua NEGATIVE NEGATIVE   RBC / HPF 0-5 0 - 5 RBC/hpf   WBC, UA 0-5 0 - 5 WBC/hpf   Bacteria, UA NONE SEEN NONE SEEN   Squamous Epithelial / LPF 0-5 0 - 5   Mucus PRESENT   Respiratory Panel by RT PCR (Flu A&B, Covid) - Nasopharyngeal Swab     Status: None   Collection Time: 10/19/19 11:28 AM   Specimen: Nasopharyngeal Swab  Result Value Ref Range   SARS Coronavirus 2 by RT PCR NEGATIVE NEGATIVE   Influenza A by PCR NEGATIVE NEGATIVE   Influenza B by PCR NEGATIVE NEGATIVE    Imaging: CT Angio Chest/Abd/Pel for Dissection W and/or Wo Contrast  Result Date: 10/19/2019 CLINICAL DATA:  Abdominal pain radiating to back, aortic dissection suspected EXAM: CT ANGIOGRAPHY CHEST, ABDOMEN AND PELVIS TECHNIQUE: Multidetector CT imaging through the chest, abdomen and pelvis was  performed using the standard protocol during bolus administration of intravenous contrast. Multiplanar reconstructed images and MIPs were obtained and reviewed to evaluate the vascular anatomy. CONTRAST:  65mL OMNIPAQUE IOHEXOL 350 MG/ML SOLN COMPARISON:  None. FINDINGS: CTA CHEST FINDINGS Cardiovascular: Preferential opacification of the thoracic aorta. Normal contour and caliber of the thoracic aorta. No evidence of aneurysm, dissection, or other acute pathology. Normal heart size. No  pericardial effusion. Mediastinum/Nodes: No enlarged mediastinal, hilar, or axillary lymph nodes. Thyroid gland, trachea, and esophagus demonstrate no significant findings. Lungs/Pleura: Lungs are clear. No pleural effusion or pneumothorax. Musculoskeletal: No chest wall abnormality. No acute or significant osseous findings. Review of the MIP images confirms the above findings. CTA ABDOMEN AND PELVIS FINDINGS VASCULAR Normal contour and caliber of the abdominal aorta. No evidence of aneurysm, dissection, or other acute pathology. Standard branching pattern. Mixed calcific atherosclerosis of the distal aorta and bilateral iliac systems. Review of the MIP images confirms the above findings. NON-VASCULAR Hepatobiliary: Hepatic steatosis. Multiple faintly rim calcified gallstones in the mildly distended gallbladder. There is gallbladder wall thickening to approximately 6 mm and hyperemia of the adjacent liver parenchyma. No biliary ductal dilatation or radiopaque calculus appreciated in the common bile duct. Pancreas: Unremarkable. No pancreatic ductal dilatation or surrounding inflammatory changes. Spleen: Normal in size without significant abnormality. Adrenals/Urinary Tract: Adrenal glands are unremarkable. Kidneys are normal, without renal calculi, solid lesion, or hydronephrosis. Bladder is unremarkable. Stomach/Bowel: Stomach is within normal limits. Appendix appears normal. No evidence of bowel wall thickening, distention, or  inflammatory changes. Lymphatic: No enlarged abdominal or pelvic lymph nodes. Reproductive: No mass or other significant abnormality. Other: No abdominal wall hernia or abnormality. No abdominopelvic ascites. Musculoskeletal: No acute or significant osseous findings. Review of the MIP images confirms the above findings. IMPRESSION: 1. Normal contour and caliber of the thoracic and abdominal aorta. No evidence of aneurysm, dissection, or other acute aortic pathology. 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 Aortic Atherosclerosis (ICD10-I70.0). 2. Cholelithiasis with gallbladder wall thickening and hyperemia of the adjacent liver parenchyma, suspicious for acute cholecystitis. 3. No biliary ductal dilatation or radiopaque calculus appreciated in the common bile duct. 4. Hepatic steatosis. Electronically Signed   By: Lauralyn Primes M.D.   On: 10/19/2019 09:04   US Abdomen Limited RUQ  Result Date: 10/19/2019 CLINICAL DATA:  Right upper quadrant pain. EXAM: ULTRASOUND ABDOMEN LIMITED RIGHT UPPER QUADRANT COMPARISON:  None. FINDINGS: Gallbladder: Extensive filling defects within the gallbladder lumen with marked gallbladder wall thickening, up to 7.6 mm. Wall assessment is quite difficult due to the extensive nature of the stones. Wall thickening is irregular and with potential web-like extension of elements of the wall into the lumen seen on transverse images of the gallbladder. No reported sonographic Murphy sign. Largest calculus 1.8 cm. Common bile duct: Diameter: 6 mm Liver: Hepatic parenchymal echogenicity is normal. 7 mm hyperechoic focus in the right hepatic lobe. Portal vein is patent on color Doppler imaging with normal direction of blood flow towards the liver. Other: None. IMPRESSION: 1. Findings concerning for acute calculus cholecystitis despite absence of sonographic Murphy's. Some of the submitted images raise the question of early  gangrenous cholecystitis, full wall assessment is limited due to the large number of stones within the gallbladder lumen. 2. Small echogenic focus in right hemi liver likely small hepatic hemangioma. Electronically Signed   By: Donzetta Kohut M.D.   On: 10/19/2019 09:58    Assessment and Plan: This is a 38 y.o. male with acute cholecystitis.  The patient is currently pain free, but his gallbladder looks very inflamed on ultrasound.  At this point, my concern is for cholecystitis.  I discussed with the patient my recommendation for admission to the hospital and taking him to the OR for laparoscopic cholecystectomy, with admission for observation and possibly discharge to home tomorrow.  He is very concerned about admission  given the COVID pandemic and having to stay in the hospital.  Discussed with him that since currently he is symptom free, one possibility would be to discharge him home with oral antibiotics and close follow up in the office to discuss outpatient cholecystectomy.  However, I could not guarantee that his symptoms won't worsen again and that he would then have to come back to the ED again.  He discussed things further with his family and has opted for admission and surgery today.  We will take him to the OR this afternoon.  Discussed with him the risks of bleeding, infection, injury to surrounding structures, and the possibility for open procedure.  He's willing to proceed.   Howie Ill, MD [facility] Pg:  [phone]

## 2019-10-19 NOTE — Transfer of Care (Signed)
Immediate Anesthesia Transfer of Care Note  Patient: David Peters  Procedure(s) Performed: LAPAROSCOPIC CHOLECYSTECTOMY (N/A )  Patient Location: PACU  Anesthesia Type:General  Level of Consciousness: awake, alert  and oriented  Airway & Oxygen Therapy: Patient Spontanous Breathing  Post-op Assessment: Report given to RN and Post -op Vital signs reviewed and stable  Post vital signs: Reviewed and stable  Last Vitals:  Vitals Value Taken Time  BP 126/52 10/19/19 1803  Temp    Pulse 82 10/19/19 1807  Resp 16 10/19/19 1807  SpO2 98 % 10/19/19 1807  Vitals shown include unvalidated device data.  Last Pain:  Vitals:   10/19/19 1441  TempSrc: Temporal  PainSc:          Complications: No apparent anesthesia complications

## 2019-10-19 NOTE — ED Notes (Signed)
Patient transported to CT 

## 2019-10-19 NOTE — Anesthesia Postprocedure Evaluation (Signed)
Anesthesia Post Note  Patient: David Peters  Procedure(s) Performed: LAPAROSCOPIC CHOLECYSTECTOMY (N/A )  Patient location during evaluation: PACU Anesthesia Type: General Level of consciousness: awake and alert Pain management: pain level controlled Vital Signs Assessment: post-procedure vital signs reviewed and stable Respiratory status: spontaneous breathing and respiratory function stable Cardiovascular status: stable Anesthetic complications: no     Last Vitals:  Vitals:   10/19/19 1818 10/19/19 1835  BP: 136/78 (!) 156/91  Pulse: 78 70  Resp:  15  Temp:    SpO2: 100% 99%    Last Pain:  Vitals:   10/19/19 1835  TempSrc:   PainSc: 0-No pain                 Rayah Fines K

## 2019-10-19 NOTE — ED Notes (Signed)
Patient aware that we need urine sample for testing, unable at this time. Pt given instruction on providing urine sample when able to do so.   

## 2019-10-19 NOTE — Anesthesia Preprocedure Evaluation (Signed)
Anesthesia Evaluation  Patient identified by MRN, date of birth, ID band Patient awake    Reviewed: Allergy & Precautions, NPO status , Patient's Chart, lab work & pertinent test results  History of Anesthesia Complications Negative for: history of anesthetic complications  Airway Mallampati: II       Dental   Pulmonary neg sleep apnea, neg COPD, Current Smoker,           Cardiovascular hypertension (pt not taking prescribed meds), (-) Past MI and (-) CHF (-) dysrhythmias (-) Valvular Problems/Murmurs     Neuro/Psych neg Seizures    GI/Hepatic Neg liver ROS, neg GERD  ,  Endo/Other  neg diabetes  Renal/GU negative Renal ROS     Musculoskeletal   Abdominal   Peds  Hematology   Anesthesia Other Findings   Reproductive/Obstetrics                             Anesthesia Physical Anesthesia Plan  ASA: II  Anesthesia Plan: General   Post-op Pain Management:    Induction: Intravenous  PONV Risk Score and Plan: 1 and Ondansetron  Airway Management Planned: Oral ETT  Additional Equipment:   Intra-op Plan:   Post-operative Plan:   Informed Consent: I have reviewed the patients History and Physical, chart, labs and discussed the procedure including the risks, benefits and alternatives for the proposed anesthesia with the patient or authorized representative who has indicated his/her understanding and acceptance.       Plan Discussed with:   Anesthesia Plan Comments:         Anesthesia Quick Evaluation

## 2019-10-19 NOTE — Anesthesia Procedure Notes (Signed)
Procedure Name: Intubation Date/Time: 10/19/2019 4:13 PM Performed by: Nelda Marseille, CRNA Pre-anesthesia Checklist: Patient identified, Patient being monitored, Timeout performed, Emergency Drugs available and Suction available Patient Re-evaluated:Patient Re-evaluated prior to induction Oxygen Delivery Method: Circle system utilized Preoxygenation: Pre-oxygenation with 100% oxygen Induction Type: IV induction Ventilation: Mask ventilation without difficulty Laryngoscope Size: Mac, McGraph and 4 Grade View: Grade I Tube type: Oral Tube size: 7.5 mm Number of attempts: 1 Airway Equipment and Method: Stylet Placement Confirmation: ETT inserted through vocal cords under direct vision,  positive ETCO2 and breath sounds checked- equal and bilateral Secured at: 21 cm Tube secured with: Tape Dental Injury: Teeth and Oropharynx as per pre-operative assessment

## 2019-10-19 NOTE — Anesthesia Post-op Follow-up Note (Signed)
Anesthesia QCDR form completed.        

## 2019-10-19 NOTE — ED Triage Notes (Signed)
Pt arrives with upper abdominal pain that radiates to the back area since he woke up this morning, describes as 'spasm' like. Associated with 1 episode of vomiting. Took indigestion medication without relief.

## 2019-10-20 ENCOUNTER — Encounter: Payer: Self-pay | Admitting: Surgery

## 2019-10-20 LAB — HIV ANTIBODY (ROUTINE TESTING W REFLEX): HIV Screen 4th Generation wRfx: NONREACTIVE

## 2019-10-20 MED ORDER — AMOXICILLIN-POT CLAVULANATE 875-125 MG PO TABS: 1.0000 | ORAL_TABLET | Freq: Two times a day (BID) | ORAL | 0 refills | Status: AC

## 2019-10-20 MED ORDER — OXYCODONE HCL 5 MG PO TABS: 5.0000 mg | ORAL_TABLET | ORAL | 0 refills | Status: DC | PRN

## 2019-10-20 MED ORDER — IBUPROFEN 600 MG PO TABS: 600.0000 mg | ORAL_TABLET | Freq: Three times a day (TID) | ORAL | 0 refills | Status: DC | PRN

## 2019-10-20 NOTE — Plan of Care (Signed)
  Problem: Education: Goal: Knowledge of General Education information will improve Description: Including pain rating scale, medication(s)/side effects and non-pharmacologic comfort measures Outcome: Completed/Met

## 2019-10-20 NOTE — Discharge Summary (Signed)
Patient ID: David Peters MRN: [id number] DOB/AGE: 1981-09-17 39 y.o.  Admit date: 10/19/2019 Discharge date: 10/20/2019   Discharge Diagnoses:  Active Problems:   Acute cholecystitis   Procedures:  Laparoscopic cholecystectomy  Hospital Course: Patient was admitted on 12/30 with acute cholecystitis and taken to the OR the same day.  Had uncomplicated laparoscopic cholecystectomy.  His diet was advanced, his pain was well controlled, he was tolerating a diet, voiding, and ambulating.  He was deemed ready for discharge.    On exam, he is no acute distress with stable vital signs.  His abdomen is soft, non-distended, appropriately sore to palpation.  Incisions clean, dry.  There is a gauze dressing on the epigastric incision due to mild drainage yesterday immediately post-op but is currently dry.  Consults: None  Disposition: Discharge disposition: 01-Home or Self Care       Discharge Instructions    Call MD for:  difficulty breathing, headache or visual disturbances   Complete by: As directed    Call MD for:  persistant nausea and vomiting   Complete by: As directed    Call MD for:  redness, tenderness, or signs of infection (pain, swelling, redness, odor or green/yellow discharge around incision site)   Complete by: As directed    Call MD for:  severe uncontrolled pain   Complete by: As directed    Call MD for:  temperature >100.4   Complete by: As directed    Diet - low sodium heart healthy   Complete by: As directed    Discharge instructions   Complete by: As directed    1.  Patient may shower, but do not scrub wounds heavily and dab dry only. 2.  Do not submerge wounds in pool/tub for 1 week. 3.  Do not apply ointments or hydrogen peroxide to the wounds.   Driving Restrictions   Complete by: As directed    Do not drive while taking narcotics for pain control.   Increase activity slowly   Complete by: As directed    Lifting restrictions   Complete by: As  directed    No heavy lifting or pushing of more than 10-15 lbs for 4 weeks.   No dressing needed   Complete by: As directed    May apply dry gauze dressing once daily to the top incision in case there is any drainage.  No longer needed when there is no drainage.     Allergies as of 10/20/2019   No Known Allergies     Medication List    TAKE these medications   amoxicillin-clavulanate 875-125 MG tablet Commonly known as: Augmentin Take 1 tablet by mouth 2 (two) times daily for 10 days.   ibuprofen 600 MG tablet Commonly known as: ADVIL Take 1 tablet (600 mg total) by mouth every 8 (eight) hours as needed for mild pain or moderate pain.   oxyCODONE 5 MG immediate release tablet Commonly known as: Oxy IR/ROXICODONE Take 1 tablet (5 mg total) by mouth every 4 (four) hours as needed for severe pain.      Follow-up Information    Tylene Fantasia, PA-C. Schedule an appointment as soon as possible for a visit in 2 weeks.   Specialty: Physician Assistant Why: May do video/virtual appointment. Contact information: [address] [place] [address] [phone]

## 2019-10-24 LAB — SURGICAL PATHOLOGY

## 2020-09-16 ENCOUNTER — Other Ambulatory Visit: Payer: Self-pay

## 2020-09-16 ENCOUNTER — Emergency Department
Admission: EM | Admit: 2020-09-16 | Discharge: 2020-09-16 | Disposition: A | Discharge status: Home / Self Care | Payer: Self-pay | Attending: Emergency Medicine | Admitting: Emergency Medicine

## 2020-09-16 DIAGNOSIS — M545 Low back pain, unspecified: Secondary | ICD-10-CM | POA: Insufficient documentation

## 2020-09-16 DIAGNOSIS — I10 Essential (primary) hypertension: Secondary | ICD-10-CM | POA: Insufficient documentation

## 2020-09-16 DIAGNOSIS — F17210 Nicotine dependence, cigarettes, uncomplicated: Secondary | ICD-10-CM | POA: Insufficient documentation

## 2020-09-16 LAB — URINALYSIS, COMPLETE (UACMP) WITH MICROSCOPIC
Bacteria, UA: NONE SEEN
Bilirubin Urine: NEGATIVE
Glucose, UA: NEGATIVE mg/dL
Hgb urine dipstick: NEGATIVE
Ketones, ur: NEGATIVE mg/dL
Leukocytes,Ua: NEGATIVE
Nitrite: NEGATIVE
Protein, ur: NEGATIVE mg/dL
Specific Gravity, Urine: 1.03 (ref 1.005–1.030)
Squamous Epithelial / HPF: NONE SEEN (ref 0–5)
pH: 5 (ref 5.0–8.0)

## 2020-09-16 MED ORDER — KETOROLAC TROMETHAMINE 30 MG/ML IJ SOLN
30.0000 mg | Freq: Once | INTRAMUSCULAR | Status: AC
  Administered 2020-09-16: 30 mg via INTRAMUSCULAR
  Filled 2020-09-16: qty 1

## 2020-09-16 MED ORDER — NAPROXEN 500 MG PO TABS: 500.0000 mg | ORAL_TABLET | Freq: Two times a day (BID) | ORAL | 0 refills | Status: AC

## 2020-09-16 MED ORDER — METHOCARBAMOL 500 MG PO TABS: 500.0000 mg | ORAL_TABLET | Freq: Four times a day (QID) | ORAL | 0 refills | Status: AC | PRN

## 2020-09-16 NOTE — ED Notes (Signed)
Pt presents to the ED for lower back pain that started yesterday. Pt is A&Ox4 and NAD. Pt ambulatory to room. Denies doing any strenuous activity during this time.

## 2020-09-16 NOTE — Discharge Instructions (Signed)
Follow-up with your primary care provider if any continued problems this week.  Begin taking Robaxin 500 mg every 6 hours as needed for muscle spasms.  Do not take this medication drive or operate machinery as it could cause drowsiness.  Naproxen is 500 mg twice daily with food.  This medication you can take and drive as it does not cause drowsiness.  Use ice or heat to your back as needed for discomfort.

## 2020-09-16 NOTE — ED Notes (Signed)
No reaction noted to injection site.  

## 2020-09-16 NOTE — ED Provider Notes (Signed)
[facility] Emergency Department Provider Note  ____________________________________________   None    (approximate)  I have reviewed the triage vital signs and the nursing notes.   HISTORY  Chief Complaint Back Pain ((Spasms))   HPI David Peters is a 39 y.o. male presents to the ED with complaint of right-sided low back pain that started yesterday.  Patient states he was at work when he felt a sudden grabbing sensation in his right lower back.  He denies any known injury.  He states was at work when this started.  He has not taken any over-the-counter medication nor has he used any ice or heat to his back.  He states that when he woke up this morning he could "barely get out of bed".  He denies any urinary symptoms or previous stones.  Patient was able to drive himself to the emergency department today.  He rates his pain as 10 out of 10.         Past Medical History:  Diagnosis Date  . Hypertension     Patient Active Problem List   Diagnosis Date Noted  . Acute cholecystitis 10/19/2019  . 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 CHOLECYSTECTOMY N/A 10/19/2019   Procedure: LAPAROSCOPIC CHOLECYSTECTOMY;  Surgeon: Henrene Dodge, MD;  Location: [facility] ORS;  Service: General;  Laterality: N/A;    Prior to Admission medications   Medication Sig Start Date End Date Taking? Authorizing Provider  methocarbamol (ROBAXIN) 500 MG tablet Take 1 tablet (500 mg total) by mouth every 6 (six) hours as needed for muscle spasms. 09/16/20   Tommi Rumps, PA-C  naproxen (NAPROSYN) 500 MG tablet Take 1 tablet (500 mg total) by mouth 2 (two) times daily with a meal. 09/16/20   Tommi Rumps, PA-C    Allergies Patient has no known allergies.  No family history on file.  Social History Social History   Tobacco Use  . Smoking status: Current Every Day Smoker    Packs/day: 0.50    Years: 5.00    Pack years:  2.50    Types: Cigarettes  . Smokeless tobacco: Never Used  Substance Use Topics  . Alcohol use: Yes    Comment: pt states he drinks about a pint of liquor on the weekends.   . Drug use: No    Review of Systems Constitutional: No fever/chills Cardiovascular: Denies chest pain. Respiratory: Denies shortness of breath. Gastrointestinal: No abdominal pain.  No nausea, no vomiting.  No diarrhea. Genitourinary: Negative for dysuria.  Negative for stones. Musculoskeletal: Positive for right-sided low back pain. Skin: Negative for rash. Neurological: Negative for headaches, focal weakness or numbness. ____________________________________________   PHYSICAL EXAM:  VITAL SIGNS: ED Triage Vitals  Enc Vitals Group     BP 09/16/20 0407 (!) 157/95     Pulse Rate 09/16/20 0407 77     Resp 09/16/20 0407 16     Temp 09/16/20 0407 97.6 F (36.4 C)     Temp Source 09/16/20 0407 Oral     SpO2 09/16/20 0407 97 %     Weight 09/16/20 0411 220 lb (99.8 kg)     Height 09/16/20 0411 5\' 8"  (1.727 m)     Head Circumference --      Peak Flow --      Pain Score 09/16/20 0409 10     Pain Loc --      Pain Edu? --  Excl. in GC? --     Constitutional: Alert and oriented. Well appearing and in no acute distress. Eyes: Conjunctivae are normal.  Head: Atraumatic. Neck: No stridor.  No cervical tenderness on palpation posteriorly. Cardiovascular: Normal rate, regular rhythm. Grossly normal heart sounds.  Good peripheral circulation. Respiratory: Normal respiratory effort.  No retractions. Lungs CTAB. Gastrointestinal: Soft and nontender. No distention.  No CVA tenderness. Musculoskeletal: Moves upper and lower extremities with any difficulty.  No thoracic or lumbar spine tenderness to palpation.  There is however tenderness to the right paravertebral muscles and in the lateral aspect.  No active muscle spasms were seen.  No evidence of injury such as abrasions or discoloration of skin.  Patient is  ambulatory while in the ED without any assistance. Neurologic:  Normal speech and language. No gross focal neurologic deficits are appreciated. No gait instability. Skin:  Skin is warm, dry and intact. No rash noted. Psychiatric: Mood and affect are normal. Speech and behavior are normal.  ____________________________________________   LABS (all labs ordered are listed, but only abnormal results are displayed)  Labs Reviewed  URINALYSIS, COMPLETE (UACMP) WITH MICROSCOPIC - Abnormal; Notable for the following components:      Result Value   Color, Urine YELLOW (*)    APPearance CLEAR (*)    All other components within normal limits   ____________________________________________  PROCEDURES  Procedure(s) performed (including Critical Care):  Procedures   ____________________________________________   INITIAL IMPRESSION / ASSESSMENT AND PLAN / ED COURSE  As part of my medical decision making, I reviewed the following data within the electronic MEDICAL RECORD NUMBER Notes from prior ED visits and [place] Controlled Substance Database  ----------------------------------------- 8:47 AM on 09/16/2020 ----------------------------------------- Recheck of patient after medication and to make you aware that urinalysis was clear.  Patient is resting comfortably and supine.  Patient states pain has decreased.  39 year old male presents to the ED with complaint of right-sided low back pain that began yesterday without any history of injury.  Patient has not taken any over-the-counter medication as he "does not take anything but prescribed medication".  Physical exam was positive for muscular pain right lower back but no point tenderness to the lumbar spine.  Patient was given Toradol 30 mg IM and urinalysis was negative for suspicion of UTI or renal stones.  Patient was resting comfortably prior to discharge.  A prescription for Robaxin and naproxen was sent to his pharmacy.  He is encouraged to use  ice or heat to his back as needed for discomfort.  He is to follow-up with his PCP if any continued problems.  ____________________________________________   FINAL CLINICAL IMPRESSION(S) / ED DIAGNOSES  Final diagnoses:  Acute right-sided low back pain without sciatica     ED Discharge Orders         Ordered    methocarbamol (ROBAXIN) 500 MG tablet  Every 6 hours PRN        09/16/20 0852    naproxen (NAPROSYN) 500 MG tablet  2 times daily with meals        09/16/20 9357          *Please note:  David Peters was evaluated in Emergency Department on 09/16/2020 for the symptoms described in the history of present illness. He was evaluated in the context of the global COVID-19 pandemic, which necessitated consideration that the patient might be at risk for infection with the SARS-CoV-2 virus that causes COVID-19. Institutional protocols and algorithms that pertain to the evaluation of  patients at risk for COVID-19 are in a state of rapid change based on information released by regulatory bodies including the CDC and federal and state organizations. These policies and algorithms were followed during the patient's care in the ED.  Some ED evaluations and interventions may be delayed as a result of limited staffing during and the pandemic.*   Note:  This document was prepared using Dragon voice recognition software and may include unintentional dictation errors.    Tommi Rumps, PA-C 09/16/20 1312    Chesley Noon, MD 09/17/20 [phone]

## 2020-09-16 NOTE — ED Triage Notes (Signed)
Pt c/o of back spasms that started 1 day ago. Pt states that spasms started all of the sudden and has not taken anything for pain. Pt reports that he was "barely able to get out of bed this morning." Pt ambulatory to triage room.

## 2021-12-06 IMAGING — US US ABDOMEN LIMITED
1 series · 14 of 25 positions shown · non-contrast
Comparison: None.

CLINICAL DATA: Right upper quadrant pain.

EXAM:
ULTRASOUND ABDOMEN LIMITED RIGHT UPPER QUADRANT

[Series 1: us abdomen limited · 14 of 31 slices shown]
[im 1/31]
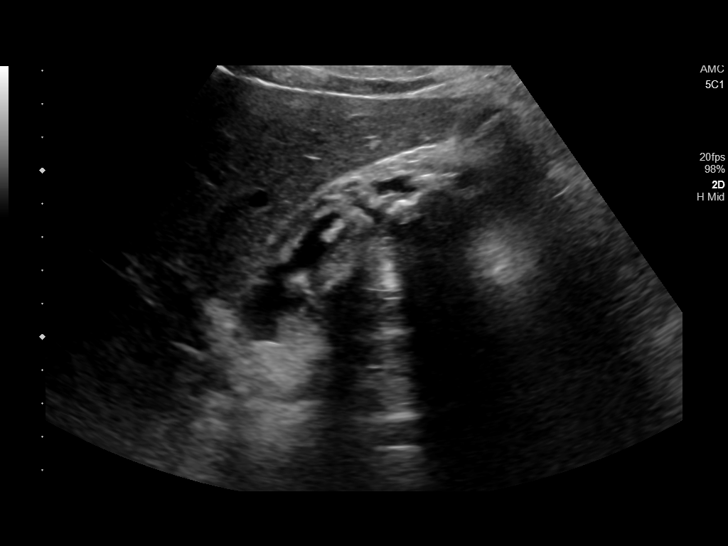
[im 3/31]
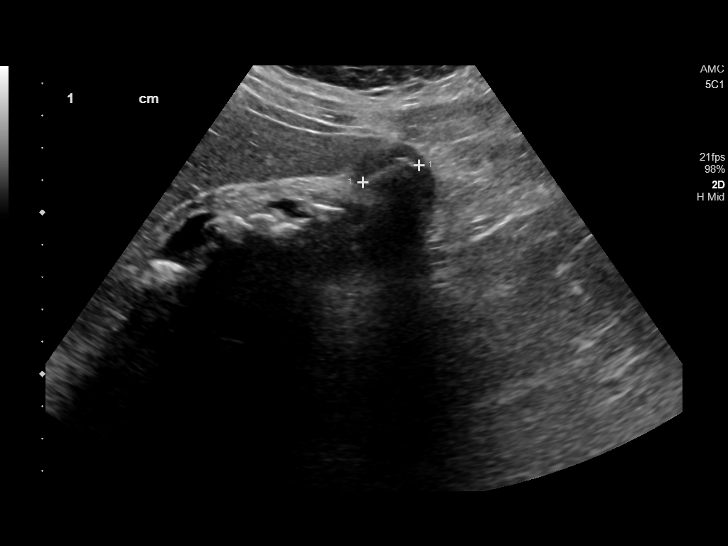
[im 6/31]
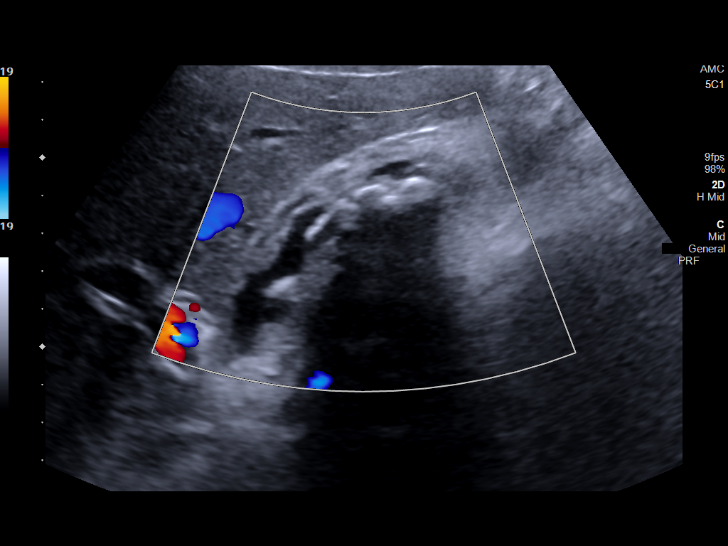
[im 8/31]
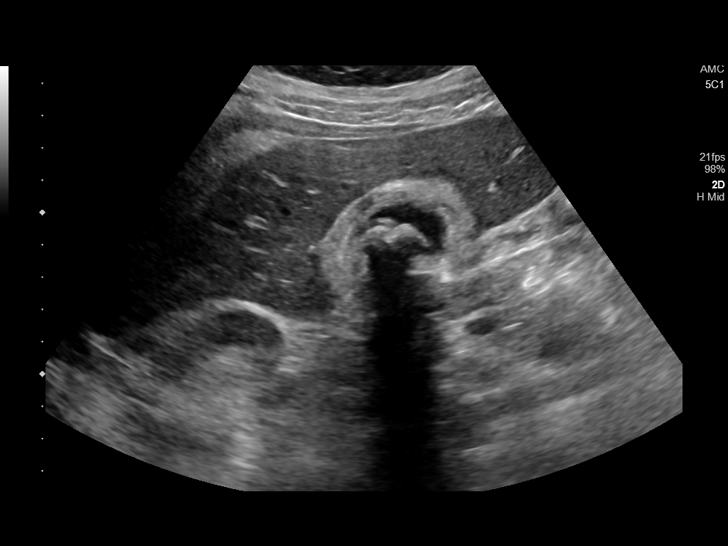
[im 11/31]
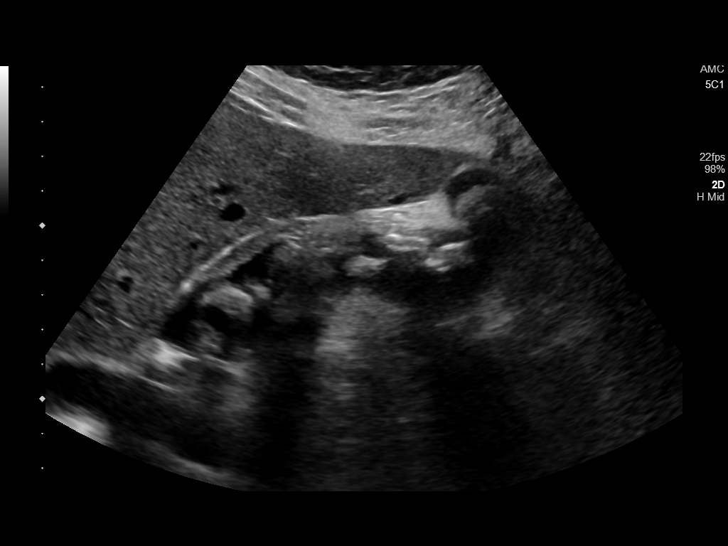
[im 12/31]
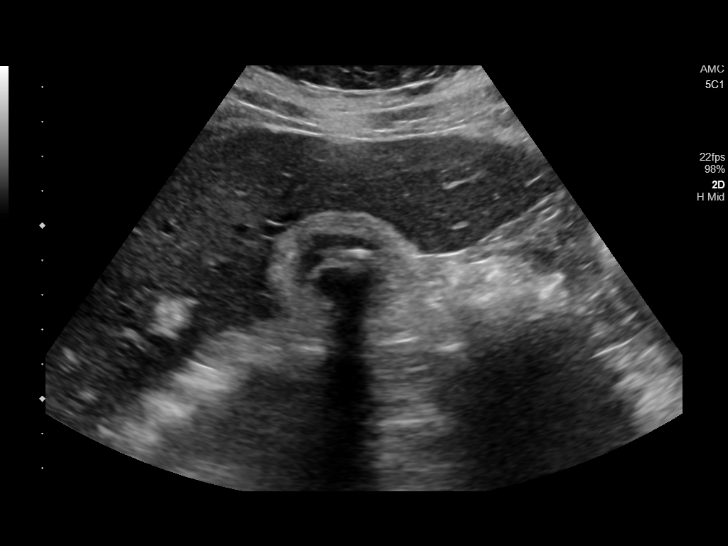
[im 14/31]
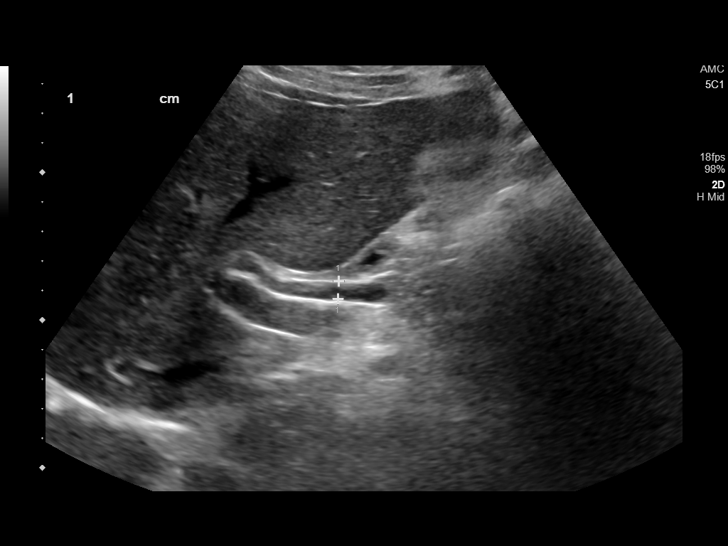
[im 17/31]
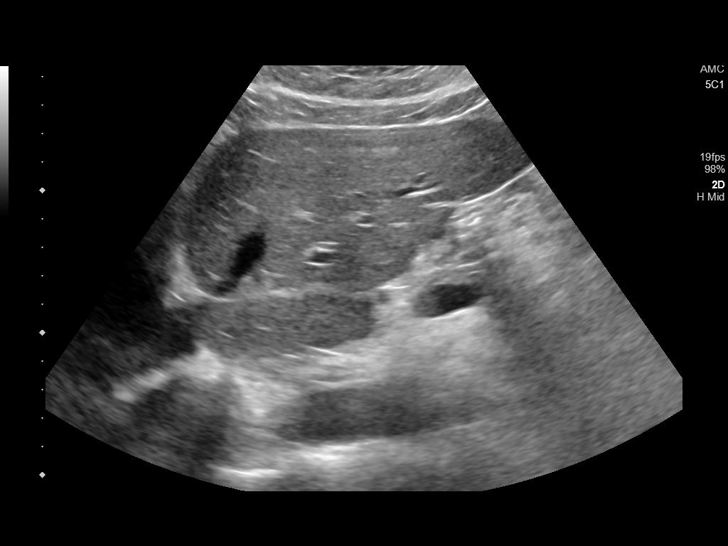
[im 19/31]
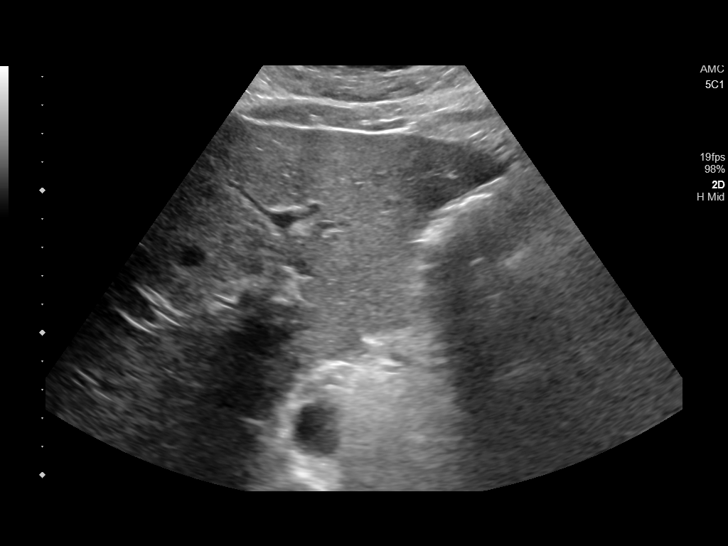
[im 21/31]
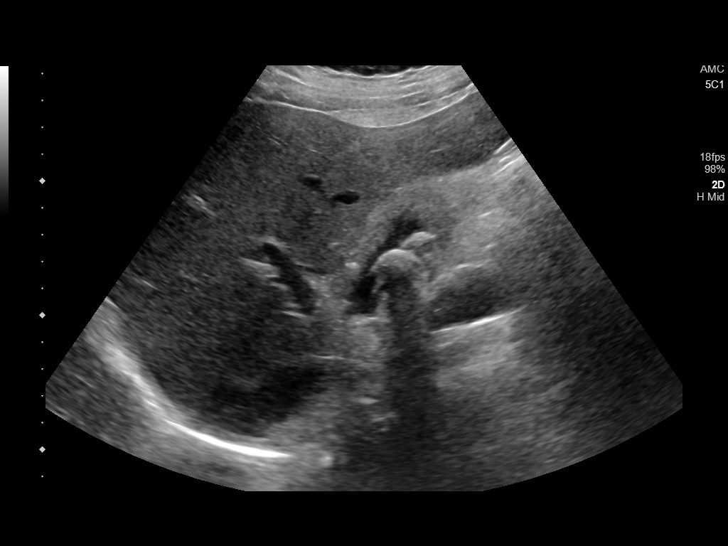
[im 23/31]
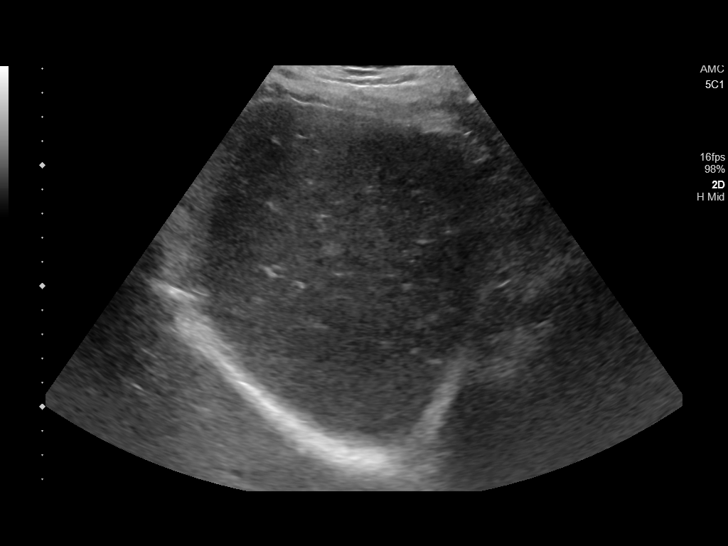
[im 26/31]
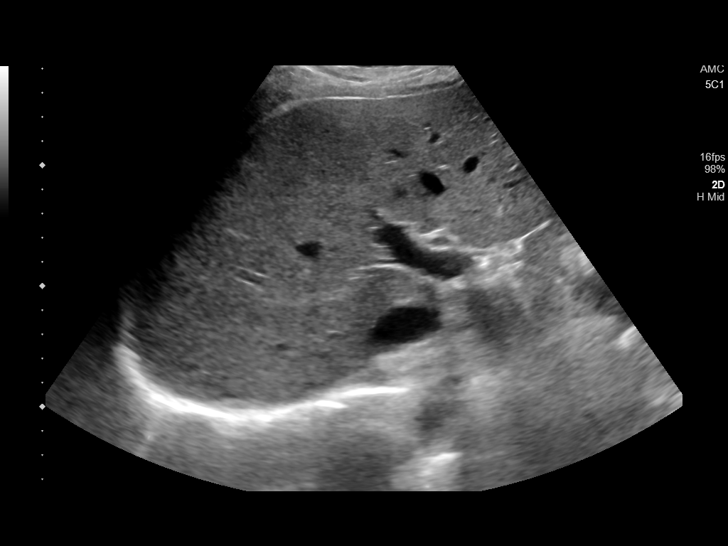
[im 28/31]
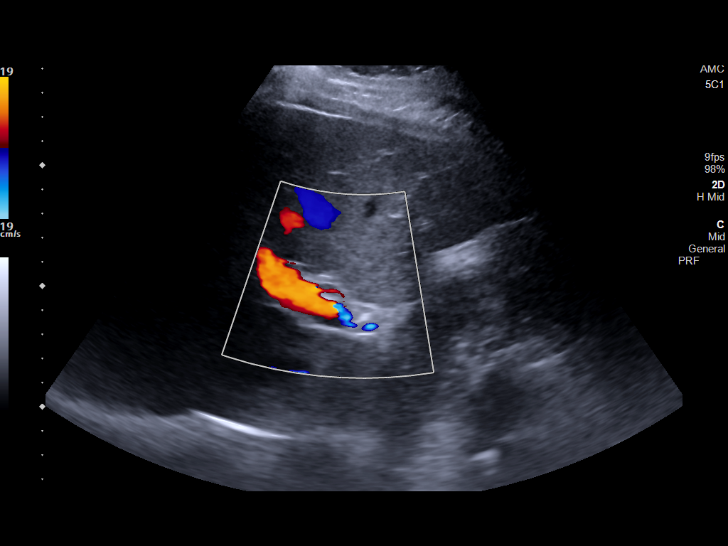
[im 31/31]
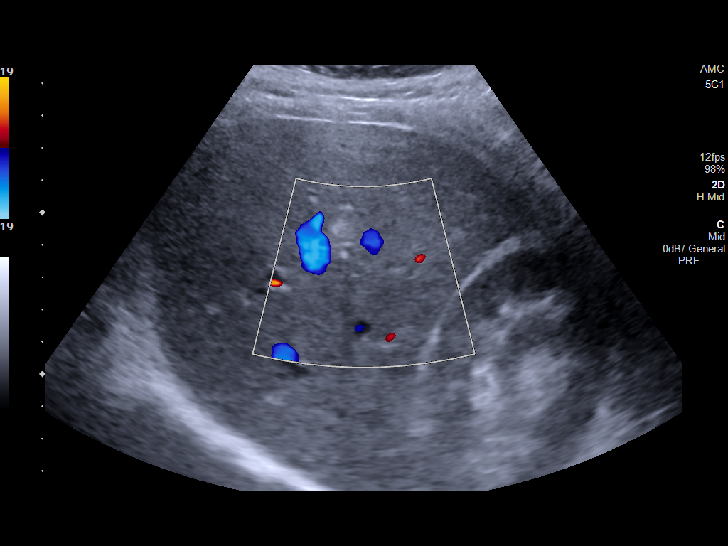

[14 of 25 positions shown; findings below may reference images not displayed]

FINDINGS: Gallbladder:

Extensive filling defects within the gallbladder lumen with marked
gallbladder wall thickening, up to 7.6 mm. Wall assessment is quite
difficult due to the extensive nature of the stones. Wall thickening
is irregular and with potential web-like extension of elements of
the wall into the lumen seen on transverse images of the
gallbladder. No reported sonographic Murphy sign. Largest calculus
1.8 cm.

Common bile duct:

Diameter: 6 mm

Liver:

Hepatic parenchymal echogenicity is normal. 7 mm hyperechoic focus
in the right hepatic lobe. Portal vein is patent on color Doppler
imaging with normal direction of blood flow towards the liver.

Other: None.
IMPRESSION: 1. Findings concerning for acute calculus cholecystitis despite
absence of sonographic [place]. Some of the submitted images raise
the question of early gangrenous cholecystitis, full wall assessment
is limited due to the large number of stones within the gallbladder
lumen.
2. Small echogenic focus in right hemi liver likely small hepatic
hemangioma.
